# Patient Record
Sex: Male | Born: 1950
Health system: Southern US, Community
[De-identification: ages and names within clinical notes are randomized; demographics above are authoritative.]

## PROBLEM LIST (undated history)

## (undated) DIAGNOSIS — E785 Hyperlipidemia, unspecified: Secondary | ICD-10-CM

## (undated) DIAGNOSIS — G4730 Sleep apnea, unspecified: Secondary | ICD-10-CM

## (undated) DIAGNOSIS — I10 Essential (primary) hypertension: Secondary | ICD-10-CM

## (undated) DIAGNOSIS — K219 Gastro-esophageal reflux disease without esophagitis: Secondary | ICD-10-CM

## (undated) DIAGNOSIS — T7840XA Allergy, unspecified, initial encounter: Secondary | ICD-10-CM

## (undated) HISTORY — DX: Allergy, unspecified, initial encounter: T78.40XA

## (undated) HISTORY — DX: Hyperlipidemia, unspecified: E78.5

## (undated) HISTORY — DX: Gastro-esophageal reflux disease without esophagitis: K21.9

## (undated) HISTORY — DX: Sleep apnea, unspecified: G47.30

## (undated) HISTORY — DX: Essential (primary) hypertension: I10

---

## 1997-11-06 ENCOUNTER — Ambulatory Visit (HOSPITAL_COMMUNITY): Admission: RE | Admit: 1997-11-06 | Discharge: 1997-11-06 | Payer: Self-pay | Admitting: Ophthalmology

## 1998-01-21 ENCOUNTER — Ambulatory Visit: Admission: RE | Admit: 1998-01-21 | Discharge: 1998-01-21 | Payer: Self-pay | Admitting: Internal Medicine

## 1998-01-27 ENCOUNTER — Emergency Department (HOSPITAL_COMMUNITY): Admission: EM | Admit: 1998-01-27 | Discharge: 1998-01-27 | Payer: Self-pay | Admitting: Emergency Medicine

## 1998-01-27 ENCOUNTER — Encounter: Payer: Self-pay | Admitting: Emergency Medicine

## 1998-03-14 ENCOUNTER — Ambulatory Visit: Admission: RE | Admit: 1998-03-14 | Discharge: 1998-03-14 | Payer: Self-pay | Admitting: Internal Medicine

## 1998-07-29 ENCOUNTER — Encounter: Admission: RE | Admit: 1998-07-29 | Discharge: 1998-07-29 | Payer: Self-pay | Admitting: Internal Medicine

## 1998-12-08 ENCOUNTER — Emergency Department (HOSPITAL_COMMUNITY): Admission: EM | Admit: 1998-12-08 | Discharge: 1998-12-08 | Payer: Self-pay | Admitting: Emergency Medicine

## 1998-12-08 ENCOUNTER — Encounter: Payer: Self-pay | Admitting: Emergency Medicine

## 1998-12-29 ENCOUNTER — Encounter: Payer: Self-pay | Admitting: Family Medicine

## 1998-12-29 ENCOUNTER — Ambulatory Visit (HOSPITAL_COMMUNITY): Admission: RE | Admit: 1998-12-29 | Discharge: 1998-12-29 | Payer: Self-pay | Admitting: Family Medicine

## 1999-01-15 ENCOUNTER — Encounter: Payer: Self-pay | Admitting: Family Medicine

## 1999-01-15 ENCOUNTER — Ambulatory Visit (HOSPITAL_COMMUNITY): Admission: RE | Admit: 1999-01-15 | Discharge: 1999-01-15 | Payer: Self-pay | Admitting: Family Medicine

## 1999-02-24 ENCOUNTER — Observation Stay (HOSPITAL_COMMUNITY): Admission: RE | Admit: 1999-02-24 | Discharge: 1999-02-25 | Payer: Self-pay | Admitting: Otolaryngology

## 2000-02-14 ENCOUNTER — Ambulatory Visit (HOSPITAL_COMMUNITY): Admission: RE | Admit: 2000-02-14 | Discharge: 2000-02-14 | Payer: Self-pay | Admitting: Gastroenterology

## 2000-02-29 ENCOUNTER — Encounter: Admission: RE | Admit: 2000-02-29 | Discharge: 2000-02-29 | Payer: Self-pay | Admitting: Otolaryngology

## 2000-02-29 ENCOUNTER — Encounter: Payer: Self-pay | Admitting: Otolaryngology

## 2000-03-10 ENCOUNTER — Ambulatory Visit (HOSPITAL_COMMUNITY): Admission: RE | Admit: 2000-03-10 | Discharge: 2000-03-10 | Payer: Self-pay | Admitting: Otolaryngology

## 2000-03-10 ENCOUNTER — Encounter: Payer: Self-pay | Admitting: Otolaryngology

## 2000-06-16 ENCOUNTER — Encounter: Admission: RE | Admit: 2000-06-16 | Discharge: 2000-06-16 | Payer: Self-pay | Admitting: *Deleted

## 2000-07-26 ENCOUNTER — Encounter: Admission: RE | Admit: 2000-07-26 | Discharge: 2000-07-26 | Payer: Self-pay | Admitting: Otolaryngology

## 2000-07-26 ENCOUNTER — Encounter: Payer: Self-pay | Admitting: Otolaryngology

## 2000-07-28 ENCOUNTER — Ambulatory Visit (HOSPITAL_BASED_OUTPATIENT_CLINIC_OR_DEPARTMENT_OTHER): Admission: RE | Admit: 2000-07-28 | Discharge: 2000-07-28 | Payer: Self-pay | Admitting: Otolaryngology

## 2000-07-28 ENCOUNTER — Encounter (INDEPENDENT_AMBULATORY_CARE_PROVIDER_SITE_OTHER): Payer: Self-pay | Admitting: *Deleted

## 2002-01-08 ENCOUNTER — Ambulatory Visit (HOSPITAL_COMMUNITY): Admission: RE | Admit: 2002-01-08 | Discharge: 2002-01-08 | Payer: Self-pay | Admitting: *Deleted

## 2002-01-08 ENCOUNTER — Encounter: Payer: Self-pay | Admitting: *Deleted

## 2002-06-28 ENCOUNTER — Encounter (INDEPENDENT_AMBULATORY_CARE_PROVIDER_SITE_OTHER): Payer: Self-pay | Admitting: *Deleted

## 2002-06-28 ENCOUNTER — Ambulatory Visit (HOSPITAL_COMMUNITY): Admission: RE | Admit: 2002-06-28 | Discharge: 2002-06-28 | Payer: Self-pay | Admitting: Gastroenterology

## 2010-04-11 HISTORY — PX: NASAL SINUS SURGERY: SHX719

## 2010-04-26 ENCOUNTER — Observation Stay (HOSPITAL_COMMUNITY)
Admission: EM | Admit: 2010-04-26 | Discharge: 2010-04-27 | Payer: Self-pay | Source: Home / Self Care | Attending: Internal Medicine | Admitting: Internal Medicine

## 2010-04-27 ENCOUNTER — Encounter (INDEPENDENT_AMBULATORY_CARE_PROVIDER_SITE_OTHER): Payer: Self-pay | Admitting: Internal Medicine

## 2010-04-28 LAB — DIFFERENTIAL
Basophils Absolute: 0 10*3/uL (ref 0.0–0.1)
Basophils Relative: 1 % (ref 0–1)
Eosinophils Absolute: 0.2 10*3/uL (ref 0.0–0.7)
Eosinophils Relative: 4 % (ref 0–5)
Lymphocytes Relative: 29 % (ref 12–46)
Lymphs Abs: 1.3 10*3/uL (ref 0.7–4.0)
Monocytes Absolute: 0.5 10*3/uL (ref 0.1–1.0)
Monocytes Relative: 11 % (ref 3–12)
Neutro Abs: 2.4 10*3/uL (ref 1.7–7.7)
Neutrophils Relative %: 55 % (ref 43–77)

## 2010-04-28 LAB — HEMOGLOBIN A1C
Hgb A1c MFr Bld: 5.7 % — ABNORMAL HIGH (ref <5.7)
Mean Plasma Glucose: 117 mg/dL — ABNORMAL HIGH (ref <117)

## 2010-04-28 LAB — POCT CARDIAC MARKERS
CKMB, poc: 2 ng/mL (ref 1.0–8.0)
CKMB, poc: <1 ng/mL — ABNORMAL LOW (ref 1.0–8.0)
Myoglobin, poc: 323 ng/mL (ref 12–200)
Myoglobin, poc: 48.9 ng/mL (ref 12–200)
Troponin i, poc: 0.06 ng/mL (ref 0.00–0.09)
Troponin i, poc: <0.05 ng/mL (ref 0.00–0.09)

## 2010-04-28 LAB — BASIC METABOLIC PANEL
BUN: 15 mg/dL (ref 6–23)
CO2: 25 mEq/L (ref 19–32)
Calcium: 8.6 mg/dL (ref 8.4–10.5)
Chloride: 105 mEq/L (ref 96–112)
Creatinine, Ser: 1.05 mg/dL (ref 0.4–1.5)
GFR calc Af Amer: >60 mL/min (ref >60)
GFR calc non Af Amer: >60 mL/min (ref >60)
Glucose, Bld: 160 mg/dL — ABNORMAL HIGH (ref 70–99)
Potassium: 3.3 mEq/L — ABNORMAL LOW (ref 3.5–5.1)
Sodium: 141 mEq/L (ref 135–145)

## 2010-04-28 LAB — CK TOTAL AND CKMB (NOT AT ARMC)
CK, MB: 2.2 ng/mL (ref 0.3–4.0)
Relative Index: 1.7 (ref 0.0–2.5)
Total CK: 129 U/L (ref 7–232)

## 2010-04-28 LAB — BRAIN NATRIURETIC PEPTIDE
Pro B Natriuretic peptide (BNP): <30 pg/mL (ref 0.0–100.0)
Pro B Natriuretic peptide (BNP): <30 pg/mL (ref 0.0–100.0)

## 2010-04-28 LAB — CBC
HCT: 36.1 % — ABNORMAL LOW (ref 39.0–52.0)
HCT: 35 % — ABNORMAL LOW (ref 39.0–52.0)
Hemoglobin: 12.4 g/dL — ABNORMAL LOW (ref 13.0–17.0)
Hemoglobin: 11.9 g/dL — ABNORMAL LOW (ref 13.0–17.0)
MCH: 31.6 pg (ref 26.0–34.0)
MCH: 31.4 pg (ref 26.0–34.0)
MCHC: 34.3 g/dL (ref 30.0–36.0)
MCHC: 34 g/dL (ref 30.0–36.0)
MCV: 92.1 fL (ref 78.0–100.0)
MCV: 92.3 fL (ref 78.0–100.0)
Platelets: 202 10*3/uL (ref 150–400)
Platelets: 208 10*3/uL (ref 150–400)
RBC: 3.92 MIL/uL — ABNORMAL LOW (ref 4.22–5.81)
RBC: 3.79 MIL/uL — ABNORMAL LOW (ref 4.22–5.81)
RDW: 13.1 % (ref 11.5–15.5)
RDW: 13 % (ref 11.5–15.5)
WBC: 4.3 10*3/uL (ref 4.0–10.5)
WBC: 5.8 10*3/uL (ref 4.0–10.5)

## 2010-04-28 LAB — CARDIAC PANEL(CRET KIN+CKTOT+MB+TROPI)
CK, MB: 1.8 ng/mL (ref 0.3–4.0)
Relative Index: 1.7 (ref 0.0–2.5)
Total CK: 108 U/L (ref 7–232)
Troponin I: 0.01 ng/mL (ref 0.00–0.06)

## 2010-04-28 LAB — TROPONIN I: Troponin I: <0.01 ng/mL (ref 0.00–0.06)

## 2010-04-28 LAB — TSH: TSH: 3.139 u[IU]/mL (ref 0.350–4.500)

## 2010-05-02 ENCOUNTER — Encounter: Payer: Self-pay | Admitting: Orthopedic Surgery

## 2010-08-27 NOTE — Op Note (Signed)
   NAME:  Louis Barnes, VERGE                      ACCOUNT NO.:  [id number]   MEDICAL RECORD NO.:  [id number]                   PATIENT TYPE:  AMB   LOCATION:  ENDO                                 FACILITY:  [facility]   PHYSICIAN:  Danise Edge, M.D.                DATE OF BIRTH:  Dec 06, 1950   DATE OF PROCEDURE:  06/28/2002  DATE OF DISCHARGE:                                 OPERATIVE REPORT   REFERRED BY:  Thora Lance, M.D.   PROCEDURE INDICATIONS:  Mr. Edenilson Austad is a 60 year old male, born  03/25/1951.  Mr. Allende is scheduled to undergo his first screening  colonoscopy with polypectomy to prevent colon cancer.   ENDOSCOPIST:  Danise Edge, M.D.   PREMEDICATION:  Demerol 50 mg, Versed 10 mg.   ENDOSCOPE:  Olympus adult colonoscope.   PROCEDURE:  After obtaining informed consent, Mr. Gilberg was placed in the  left lateral decubitus position.  I administered intravenous Demerol and  intravenous Versed to achieve conscious sedation for the procedure.  The  patient's blood pressure, oxygen saturation, and cardiac rhythm were  monitored throughout the procedure and documented in the medical record.   Anal inspection was normal.  Digital rectal exam reveals a small, non-  nodular prostate.  The Olympus adult colonoscope was introduced into the  rectum and easily advanced to the cecum.  A normal-appearing ileocecal valve  was intubated and the distal ileum inspected.  Colonic preparation for the  exam today was excellent.   Rectum:  From the mid rectum, a 1-mm sessile polyp was removed with a cold  biopsy forceps.   Sigmoid colon and descending colon:  Left colonic diverticulosis.   Splenic flexure:  Normal.   Transverse colon:  Normal.   Hepatic flexure:  Normal.   Ascending colon:  Normal.   Cecum and ileocecal valve:  Normal.   Distal ileum:  Normal.    ASSESSMENT:  1. Left colonic diverticulosis.  2. A diminutive polyp was removed from the mid  rectum.                                               Danise Edge, M.D.    MJ/MEDQ  D:  06/28/2002  T:  06/29/2002  Job:  [id number]   cc:   Thora Lance, M.D.  [address]  [place]  [place] [address]  Fax: [phone]   Titus Dubin. Alwyn Ren, M.D. [facility]

## 2010-08-27 NOTE — Op Note (Signed)
[place]  Patient:    Louis Barnes, Louis Barnes                   MRN: [id number] Proc. Date: 07/28/00 Adm. Date:  [id number] Attending:  Carlean Purl CC:         Titus Dubin. Alwyn Ren, M.D. [facility]   Operative Report  PREOPERATIVE DIAGNOSIS:  Chronic left submandibular sialadenitis with chronic left submandibular pain.  POSTOPERATIVE DIAGNOSIS:  Chronic left submandibular sialadenitis with chronic left submandibular pain.  OPERATION:  Excision of left submandibular gland.  SURGEON:  Kristine Garbe. Ezzard Standing, M.D.  ANESTHESIA:  General endotracheal anesthesia.  COMPLICATIONS:  None.  BRIEF CLINICAL NOTE:  Louis Barnes is a 60 year old gentleman who has had chronic left submandibular pain for several years.  He apparently has had previous episodes of swelling of the submandibular gland.  More recently the gland has really not been swollen, but he has had more just chronic pain.  On examination in the office, he has fairly benign exam of the submandibular gland, although it is painful to palpation.  Because of the chronic discomfort, he is taken to the operating room at this time for excision of left submandibular gland.  DESCRIPTION OF PROCEDURE:  After adequate endotracheal anesthesia, the patient received 1 g Ancef IV preoperatively.  The left neck was prepped with Betadine solution and draped out with sterile towels.  The proposed incision site was marked out and injected with Xylocaine with epinephrine for hemostasis.  A horizontal incision was made just on the inferior edge of the submandibular gland.  The platysma muscle was then transected, and the inferior edge of the gland was identified at the upper portion of our incision.  The capsule of the gland was identified, and dissection was carried out on the gland, retracting the capsule of the gland superiorly to avoid damage to the marginal nerve. The facial vein and artery were  identified in the posterior of the gland, and the small branches to the gland were ligated with 3-0 silk ligatures and divided.  The gland was then freed up and dissected out of the submandibular fossa.  The submandibular duct was clamped, ligated with 2-0 silk ligature, and the branch of the lingual nerve was also clamped, divided, and ligated with 3-0 silk ligature.  Hemostasis was obtained with bipolar cautery.  The wound was irrigated with saline and closed with 3-0 chromic sutures subcutaneously and to reapproximate the platysma muscle and 5-0 nylon on the skin, charged with local anesthesia and transferred to the recovery room postoperatively doing well.  DISPOSITION:  Louis Barnes is discharged home later this morning on Keflex 500 b.i.d. for 5 days and Tylenol No. 3 p.r.n. pain.  We will have him follow up in my office in six to seven days for recheck and to have sutures removed. DD:  07/28/00 TD:  07/29/00 Job: [id number]

## 2011-09-19 ENCOUNTER — Other Ambulatory Visit: Payer: Self-pay | Admitting: Dermatology

## 2011-11-21 ENCOUNTER — Encounter: Payer: Self-pay | Admitting: Internal Medicine

## 2011-12-22 ENCOUNTER — Ambulatory Visit (AMBULATORY_SURGERY_CENTER): Payer: BC Managed Care – PPO | Admitting: *Deleted

## 2011-12-22 VITALS — Ht 70.0 in | Wt 213.6 lb

## 2011-12-22 DIAGNOSIS — Z1211 Encounter for screening for malignant neoplasm of colon: Secondary | ICD-10-CM

## 2011-12-22 MED ORDER — MOVIPREP 100 G PO SOLR: ORAL | Status: DC

## 2011-12-23 ENCOUNTER — Encounter: Payer: Self-pay | Admitting: Internal Medicine

## 2011-12-26 ENCOUNTER — Telehealth: Payer: Self-pay | Admitting: Internal Medicine

## 2011-12-26 NOTE — Telephone Encounter (Signed)
No charge,

## 2011-12-28 ENCOUNTER — Encounter: Payer: BC Managed Care – PPO | Admitting: Internal Medicine

## 2012-09-13 ENCOUNTER — Other Ambulatory Visit: Payer: Self-pay | Admitting: Gastroenterology

## 2013-06-11 ENCOUNTER — Encounter: Payer: Self-pay | Admitting: *Deleted

## 2013-06-11 DIAGNOSIS — I10 Essential (primary) hypertension: Secondary | ICD-10-CM | POA: Insufficient documentation

## 2013-06-11 DIAGNOSIS — L509 Urticaria, unspecified: Secondary | ICD-10-CM | POA: Insufficient documentation

## 2013-06-11 DIAGNOSIS — N529 Male erectile dysfunction, unspecified: Secondary | ICD-10-CM | POA: Insufficient documentation

## 2013-06-11 DIAGNOSIS — N400 Enlarged prostate without lower urinary tract symptoms: Secondary | ICD-10-CM | POA: Insufficient documentation

## 2013-06-11 DIAGNOSIS — G4733 Obstructive sleep apnea (adult) (pediatric): Secondary | ICD-10-CM | POA: Insufficient documentation

## 2015-07-16 DIAGNOSIS — J019 Acute sinusitis, unspecified: Secondary | ICD-10-CM | POA: Diagnosis not present

## 2015-08-19 DIAGNOSIS — I10 Essential (primary) hypertension: Secondary | ICD-10-CM | POA: Diagnosis not present

## 2015-08-19 DIAGNOSIS — G4733 Obstructive sleep apnea (adult) (pediatric): Secondary | ICD-10-CM | POA: Diagnosis not present

## 2015-08-19 DIAGNOSIS — K219 Gastro-esophageal reflux disease without esophagitis: Secondary | ICD-10-CM | POA: Diagnosis not present

## 2015-08-19 DIAGNOSIS — N400 Enlarged prostate without lower urinary tract symptoms: Secondary | ICD-10-CM | POA: Diagnosis not present

## 2015-09-15 DIAGNOSIS — J343 Hypertrophy of nasal turbinates: Secondary | ICD-10-CM | POA: Diagnosis not present

## 2015-09-15 DIAGNOSIS — J310 Chronic rhinitis: Secondary | ICD-10-CM | POA: Diagnosis not present

## 2015-09-15 DIAGNOSIS — J0141 Acute recurrent pansinusitis: Secondary | ICD-10-CM | POA: Diagnosis not present

## 2015-10-07 DIAGNOSIS — J310 Chronic rhinitis: Secondary | ICD-10-CM | POA: Diagnosis not present

## 2015-10-07 DIAGNOSIS — J0141 Acute recurrent pansinusitis: Secondary | ICD-10-CM | POA: Diagnosis not present

## 2015-10-07 DIAGNOSIS — G4733 Obstructive sleep apnea (adult) (pediatric): Secondary | ICD-10-CM | POA: Diagnosis not present

## 2015-11-03 DIAGNOSIS — N410 Acute prostatitis: Secondary | ICD-10-CM | POA: Diagnosis not present

## 2015-11-24 DIAGNOSIS — J209 Acute bronchitis, unspecified: Secondary | ICD-10-CM | POA: Diagnosis not present

## 2015-11-24 DIAGNOSIS — J0140 Acute pansinusitis, unspecified: Secondary | ICD-10-CM | POA: Diagnosis not present

## 2015-11-29 DIAGNOSIS — J209 Acute bronchitis, unspecified: Secondary | ICD-10-CM | POA: Diagnosis not present

## 2015-12-08 DIAGNOSIS — J45901 Unspecified asthma with (acute) exacerbation: Secondary | ICD-10-CM | POA: Diagnosis not present

## 2016-01-12 DIAGNOSIS — J069 Acute upper respiratory infection, unspecified: Secondary | ICD-10-CM | POA: Diagnosis not present

## 2016-02-22 DIAGNOSIS — Z0000 Encounter for general adult medical examination without abnormal findings: Secondary | ICD-10-CM | POA: Diagnosis not present

## 2016-02-22 DIAGNOSIS — I10 Essential (primary) hypertension: Secondary | ICD-10-CM | POA: Diagnosis not present

## 2016-02-22 DIAGNOSIS — E7800 Pure hypercholesterolemia, unspecified: Secondary | ICD-10-CM | POA: Diagnosis not present

## 2016-02-22 DIAGNOSIS — Z23 Encounter for immunization: Secondary | ICD-10-CM | POA: Diagnosis not present

## 2016-02-22 DIAGNOSIS — G4733 Obstructive sleep apnea (adult) (pediatric): Secondary | ICD-10-CM | POA: Diagnosis not present

## 2016-02-22 DIAGNOSIS — K219 Gastro-esophageal reflux disease without esophagitis: Secondary | ICD-10-CM | POA: Diagnosis not present

## 2016-04-11 DIAGNOSIS — J019 Acute sinusitis, unspecified: Secondary | ICD-10-CM | POA: Diagnosis not present

## 2016-04-11 DIAGNOSIS — J209 Acute bronchitis, unspecified: Secondary | ICD-10-CM | POA: Diagnosis not present

## 2016-04-20 DIAGNOSIS — M722 Plantar fascial fibromatosis: Secondary | ICD-10-CM | POA: Diagnosis not present

## 2016-04-22 DIAGNOSIS — J069 Acute upper respiratory infection, unspecified: Secondary | ICD-10-CM | POA: Diagnosis not present

## 2016-05-07 DIAGNOSIS — H6500 Acute serous otitis media, unspecified ear: Secondary | ICD-10-CM | POA: Diagnosis not present

## 2016-05-07 DIAGNOSIS — J309 Allergic rhinitis, unspecified: Secondary | ICD-10-CM | POA: Diagnosis not present

## 2016-05-07 DIAGNOSIS — J209 Acute bronchitis, unspecified: Secondary | ICD-10-CM | POA: Diagnosis not present

## 2016-05-27 DIAGNOSIS — M25562 Pain in left knee: Secondary | ICD-10-CM | POA: Diagnosis not present

## 2016-06-10 DIAGNOSIS — G4733 Obstructive sleep apnea (adult) (pediatric): Secondary | ICD-10-CM | POA: Diagnosis not present

## 2016-08-08 DIAGNOSIS — D1801 Hemangioma of skin and subcutaneous tissue: Secondary | ICD-10-CM | POA: Diagnosis not present

## 2016-08-08 DIAGNOSIS — L821 Other seborrheic keratosis: Secondary | ICD-10-CM | POA: Diagnosis not present

## 2016-08-08 DIAGNOSIS — D225 Melanocytic nevi of trunk: Secondary | ICD-10-CM | POA: Diagnosis not present

## 2016-08-08 DIAGNOSIS — L570 Actinic keratosis: Secondary | ICD-10-CM | POA: Diagnosis not present

## 2016-08-08 DIAGNOSIS — L820 Inflamed seborrheic keratosis: Secondary | ICD-10-CM | POA: Diagnosis not present

## 2016-08-08 DIAGNOSIS — D224 Melanocytic nevi of scalp and neck: Secondary | ICD-10-CM | POA: Diagnosis not present

## 2016-08-22 DIAGNOSIS — M5432 Sciatica, left side: Secondary | ICD-10-CM | POA: Diagnosis not present

## 2016-08-22 DIAGNOSIS — K219 Gastro-esophageal reflux disease without esophagitis: Secondary | ICD-10-CM | POA: Diagnosis not present

## 2016-08-22 DIAGNOSIS — N400 Enlarged prostate without lower urinary tract symptoms: Secondary | ICD-10-CM | POA: Diagnosis not present

## 2016-08-22 DIAGNOSIS — I10 Essential (primary) hypertension: Secondary | ICD-10-CM | POA: Diagnosis not present

## 2016-09-12 DIAGNOSIS — J019 Acute sinusitis, unspecified: Secondary | ICD-10-CM | POA: Diagnosis not present

## 2016-10-04 DIAGNOSIS — M25552 Pain in left hip: Secondary | ICD-10-CM | POA: Diagnosis not present

## 2016-10-04 DIAGNOSIS — S83282A Other tear of lateral meniscus, current injury, left knee, initial encounter: Secondary | ICD-10-CM | POA: Diagnosis not present

## 2016-10-08 DIAGNOSIS — M25562 Pain in left knee: Secondary | ICD-10-CM | POA: Diagnosis not present

## 2016-12-23 DIAGNOSIS — M6208 Separation of muscle (nontraumatic), other site: Secondary | ICD-10-CM | POA: Diagnosis not present

## 2017-01-26 DIAGNOSIS — M6208 Separation of muscle (nontraumatic), other site: Secondary | ICD-10-CM | POA: Diagnosis not present

## 2017-02-05 DIAGNOSIS — J0110 Acute frontal sinusitis, unspecified: Secondary | ICD-10-CM | POA: Diagnosis not present

## 2017-02-05 DIAGNOSIS — J0100 Acute maxillary sinusitis, unspecified: Secondary | ICD-10-CM | POA: Diagnosis not present

## 2017-02-07 DIAGNOSIS — H5203 Hypermetropia, bilateral: Secondary | ICD-10-CM | POA: Diagnosis not present

## 2017-02-07 DIAGNOSIS — H524 Presbyopia: Secondary | ICD-10-CM | POA: Diagnosis not present

## 2017-02-23 ENCOUNTER — Other Ambulatory Visit: Payer: Self-pay | Admitting: Internal Medicine

## 2017-02-23 DIAGNOSIS — Z23 Encounter for immunization: Secondary | ICD-10-CM | POA: Diagnosis not present

## 2017-02-23 DIAGNOSIS — Z0000 Encounter for general adult medical examination without abnormal findings: Secondary | ICD-10-CM | POA: Diagnosis not present

## 2017-02-23 DIAGNOSIS — G4733 Obstructive sleep apnea (adult) (pediatric): Secondary | ICD-10-CM | POA: Diagnosis not present

## 2017-02-23 DIAGNOSIS — G4700 Insomnia, unspecified: Secondary | ICD-10-CM | POA: Diagnosis not present

## 2017-02-23 DIAGNOSIS — M6208 Separation of muscle (nontraumatic), other site: Secondary | ICD-10-CM

## 2017-02-23 DIAGNOSIS — E7800 Pure hypercholesterolemia, unspecified: Secondary | ICD-10-CM | POA: Diagnosis not present

## 2017-02-23 DIAGNOSIS — I10 Essential (primary) hypertension: Secondary | ICD-10-CM | POA: Diagnosis not present

## 2017-02-23 DIAGNOSIS — K219 Gastro-esophageal reflux disease without esophagitis: Secondary | ICD-10-CM | POA: Diagnosis not present

## 2017-02-28 ENCOUNTER — Other Ambulatory Visit: Payer: Self-pay | Admitting: Internal Medicine

## 2017-02-28 DIAGNOSIS — M6208 Separation of muscle (nontraumatic), other site: Secondary | ICD-10-CM

## 2017-03-01 ENCOUNTER — Other Ambulatory Visit: Payer: Self-pay

## 2017-03-06 ENCOUNTER — Ambulatory Visit
Admission: RE | Admit: 2017-03-06 | Discharge: 2017-03-06 | Disposition: A | Discharge status: Home / Self Care | Payer: BLUE CROSS/BLUE SHIELD | Source: Ambulatory Visit | Attending: Internal Medicine | Admitting: Internal Medicine

## 2017-03-06 DIAGNOSIS — M6208 Separation of muscle (nontraumatic), other site: Secondary | ICD-10-CM

## 2017-03-06 DIAGNOSIS — K7689 Other specified diseases of liver: Secondary | ICD-10-CM | POA: Diagnosis not present

## 2017-03-14 ENCOUNTER — Telehealth: Payer: Self-pay | Admitting: Physical Therapy

## 2017-03-14 NOTE — Telephone Encounter (Signed)
Left message on 02/27/17, spoke with pt on 11/26 and he wants to wait for US results on 11/28. Left message on 03/14/17 to call to schedule PT eval

## 2017-04-09 DIAGNOSIS — J209 Acute bronchitis, unspecified: Secondary | ICD-10-CM | POA: Diagnosis not present

## 2017-04-21 ENCOUNTER — Other Ambulatory Visit: Payer: Self-pay

## 2017-04-21 ENCOUNTER — Encounter: Payer: Self-pay | Admitting: Physical Therapy

## 2017-04-21 ENCOUNTER — Ambulatory Visit: Payer: BLUE CROSS/BLUE SHIELD | Attending: Internal Medicine | Admitting: Physical Therapy

## 2017-04-21 DIAGNOSIS — R1010 Upper abdominal pain, unspecified: Secondary | ICD-10-CM | POA: Diagnosis not present

## 2017-04-21 DIAGNOSIS — M6281 Muscle weakness (generalized): Secondary | ICD-10-CM | POA: Diagnosis not present

## 2017-04-21 NOTE — Therapy (Signed)
[facility] [address] [place], [place], [address] Phone: [phone]   Fax:  [phone]  Physical Therapy Evaluation  Patient Details  Name: Louis Barnes MRN: [id number] Date of Birth: 12-19-1950 Referring Provider: Kirby Funk   Encounter Date: 04/21/2017  PT End of Session - 04/21/17 1048    Visit Number  1    Date for PT Re-Evaluation  06/19/17    PT Start Time  1003    PT Stop Time  1052    PT Time Calculation (min)  49 min    Activity Tolerance  Patient tolerated treatment well    Behavior During Therapy  [facility] for tasks assessed/performed       Past Medical History:  Diagnosis Date  . Allergy   . GERD (gastroesophageal reflux disease)   . Hyperlipidemia   . Hypertension   . Sleep apnea    cpap    Past Surgical History:  Procedure Laterality Date  . NASAL SINUS SURGERY  2012    There were no vitals filed for this visit.   Subjective Assessment - 04/21/17 1006    Subjective  Patient reports that he started noticing abdominal discomfort about 2-3 years.  He reports that recently he has had some increased abdominal pain, he has been diagnosed with a diastisis recti.      Limitations  Lifting    Patient Stated Goals  have less pain    Currently in Pain?  Yes    Pain Score  1     Pain Location  Abdomen    Pain Orientation  Upper    Pain Descriptors / Indicators  Discomfort    Pain Type  Chronic pain    Pain Onset  More than a month ago    Pain Frequency  Intermittent    Aggravating Factors   ab roller, twisting, planks, pushups pain can be up 6/10 with him being careful, reports prior to really watching what he does pain was a 9-10/10    Pain Relieving Factors  lie down pain can be gone, reports being very careful has helped a lot    Effect of Pain on Daily Activities  I really just have to be careful         [facility] PT Assessment - 04/21/17 0001      Assessment   Medical Diagnosis   diastisis recti    Referring Provider  Kirby Funk    Onset Date/Surgical Date  03/21/17    Prior Therapy  no      Precautions   Precaution Comments  none      Balance Screen   Has the patient fallen in the past 6 months  No    Has the patient had a decrease in activity level because of a fear of falling?   No    Is the patient reluctant to leave their home because of a fear of falling?   No      Home Environment   Additional Comments  does yardwork, housework      Prior Function   Level of Independence  Independent    Vocation  Full time employment    Vocation Requirements  Mostly sitting    Leisure  has had to stop playing racquest ball, does a lot of exercise, was doing planks, ab roller and pushups      ROM / Strength   AROM / PROM / Strength  AROM;PROM;Strength  AROM   Overall AROM Comments  WFL's but pain inthe abdomen with lumbar extension      Strength   Overall Strength Comments  4+/5 with pain inthe abdomen for hip flexion      Flexibility   Soft Tissue Assessment /Muscle Length  yes    Hamstrings  very tight    Quadriceps  tight    Piriformis  tight      Palpation   Palpation comment  very tender to palpation over the diastisis recti, has good abdominal contraction with 2 finger width seperation             Objective measurements completed on examination: See above findings.              PT Education - 04/21/17 1047    Education provided  Yes    Education Details  HEP for self bracing with sheet, starting with pelvic tilts and then tilts with single leg lifts, TA activation, went over body mechanics to decrease stress onthe spine and abdomen    Person(s) Educated  Patient    Methods  Explanation;Demonstration;Verbal cues;Tactile cues;Handout    Comprehension  Verbalized understanding       PT Short Term Goals - 04/21/17 1056      PT SHORT TERM GOAL #1   Title  independent with initial HEP    Time  2    Period  Weeks    Status   New        PT Long Term Goals - 04/21/17 1056      PT LONG TERM GOAL #1   Title  understand proper posture and body mechanics to limit pain in the abdomen    Time  6    Period  Weeks    Status  New      PT LONG TERM GOAL #2   Title  decrease pain with ADL's 50%    Time  8    Period  Weeks    Status  New      PT LONG TERM GOAL #4   Title  return to gym without issues safely    Time  8    Period  Weeks    Status  New             Plan - 04/21/17 1049    Clinical Impression Statement  Patient with abdominal pain for about 3 years, he has been diagnosed with a diastisis recti, I measured it today it is 6" long from the sternum to the navel and is 2 finger widths.  Pressure over this is "uncomfortable".  Has some limitaiton in LE strength due to pain in the abdomen, rotation causes increased pain.  He uses poor body mechanics that incresases pressure on the abdomen and I went over this with him.  He is tight in the HS which could increase abdominal pressure with activity.  He is very active and is working out, I questioned him and it seems that he is doing many things that may not be good for his diagnosis, like double leg lifts, an ab roller etc..    Clinical Presentation  Stable    Clinical Decision Making  Low    Rehab Potential  Good    PT Frequency  1x / week    PT Duration  6 weeks    PT Treatment/Interventions  ADLs/Self Care Home Management;Functional mobility training;Neuromuscular re-education;Patient/family education    PT Next Visit Plan  may need self bracing, will  need to work on TA activation, anti rotation exercises but limit abdominal pressure that causes bulging and seperation    Consulted and Agree with Plan of Care  Patient       Patient will benefit from skilled therapeutic intervention in order to improve the following deficits and impairments:  Decreased range of motion, Pain, Impaired flexibility, Improper body mechanics, Decreased strength  Visit  Diagnosis: Muscle weakness (generalized) - Plan: PT plan of care cert/re-cert  Pain of upper abdomen - Plan: PT plan of care cert/re-cert     Problem List Patient Active Problem List   Diagnosis Date Noted  . BPH (benign prostatic hypertrophy) 06/11/2013  . Hypertension 06/11/2013  . Obstructive sleep apnea 06/11/2013  . Erectile dysfunction 06/11/2013  . Urticaria 06/11/2013    Jearld Lesch., PT 04/21/2017, 10:59 AM  [facility] [address] [place], [place], [address] Phone: [phone]   Fax:  [phone]  Name: Louis Barnes MRN: [id number] Date of Birth: 12/03/1950

## 2017-04-23 DIAGNOSIS — J019 Acute sinusitis, unspecified: Secondary | ICD-10-CM | POA: Diagnosis not present

## 2017-04-23 DIAGNOSIS — J069 Acute upper respiratory infection, unspecified: Secondary | ICD-10-CM | POA: Diagnosis not present

## 2017-05-04 ENCOUNTER — Ambulatory Visit: Payer: BLUE CROSS/BLUE SHIELD | Admitting: Physical Therapy

## 2017-05-04 ENCOUNTER — Encounter: Payer: Self-pay | Admitting: Physical Therapy

## 2017-05-04 DIAGNOSIS — M6281 Muscle weakness (generalized): Secondary | ICD-10-CM

## 2017-05-04 DIAGNOSIS — R1010 Upper abdominal pain, unspecified: Secondary | ICD-10-CM | POA: Diagnosis not present

## 2017-05-04 NOTE — Therapy (Signed)
[facility] [address] [place], [place], [address] Phone: [phone]   Fax:  [phone]  Physical Therapy Treatment  Patient Details  Name: Louis Barnes MRN: [id number] Date of Birth: October 03, 1950 Referring Provider: Kirby Funk   Encounter Date: 05/04/2017  PT End of Session - 05/04/17 1717    Visit Number  2    Date for PT Re-Evaluation  06/19/17    PT Start Time  1615    PT Stop Time  1648    PT Time Calculation (min)  33 min    Activity Tolerance  Patient tolerated treatment well    Behavior During Therapy  [facility] for tasks assessed/performed       Past Medical History:  Diagnosis Date  . Allergy   . GERD (gastroesophageal reflux disease)   . Hyperlipidemia   . Hypertension   . Sleep apnea    cpap    Past Surgical History:  Procedure Laterality Date  . NASAL SINUS SURGERY  2012    There were no vitals filed for this visit.  Subjective Assessment - 05/04/17 1710    Subjective  Patient reports no issues with the exercises.                      [facility] Adult PT Treatment/Exercise - 05/04/17 0001      Self-Care   Self-Care  Other Self-Care Comments;ADL's    ADL's  Went over correct transitions from supine to sit to decrease abdominal pressure, proper lifting techniques    Other Self-Care Comments   went over abdominal binder/bracing for advanced exercises as he wants to do heavier weight and to return to racquestball, went over the outcome of not making the diastisis recti worse.  went over gym activitiies TA activation, we talked about the outcomes, talked about surgery etc..               PT Short Term Goals - 04/21/17 1056      PT SHORT TERM GOAL #1   Title  independent with initial HEP    Time  2    Period  Weeks    Status  New        PT Long Term Goals - 05/04/17 1722      PT LONG TERM GOAL #1   Title  understand proper posture and body mechanics to limit  pain in the abdomen    Status  Achieved      PT LONG TERM GOAL #2   Title  decrease pain with ADL's 50%    Status  Achieved            Plan - 05/04/17 1717    Clinical Impression Statement  Patient really is doing a lot on his own, I tried to educate as good as I could about the possible risk with some of his activities that he could cause further seperation, I talked about TA and core activation prior any heavy activity, we talked about abdominal bracing for prevention if he returns to racquetball.  I really respect the fact that he is so active but told him I worry about further seperation, went over body mechanics for just ADL's to limit stress on the abdomen.      PT Next Visit Plan  Will D/C, patient has a great understanding of his diagnosis and how to prevent further seperation    Consulted and Agree with Plan of Care  Patient  Patient will benefit from skilled therapeutic intervention in order to improve the following deficits and impairments:  Decreased range of motion, Pain, Impaired flexibility, Improper body mechanics, Decreased strength  Visit Diagnosis: Muscle weakness (generalized)  Pain of upper abdomen     Problem List Patient Active Problem List   Diagnosis Date Noted  . BPH (benign prostatic hypertrophy) 06/11/2013  . Hypertension 06/11/2013  . Obstructive sleep apnea 06/11/2013  . Erectile dysfunction 06/11/2013  . Urticaria 06/11/2013    Louis Barnes,Louis Lesh W., PT 05/04/2017, 5:23 PM  [facility] [address] [place], [place], [address] Phone: [phone]   Fax:  [phone]  Name: Louis Barnes MRN: [id number] Date of Birth: Sep 08, 1950

## 2017-08-10 DIAGNOSIS — D216 Benign neoplasm of connective and other soft tissue of trunk, unspecified: Secondary | ICD-10-CM | POA: Diagnosis not present

## 2017-08-10 DIAGNOSIS — R52 Pain, unspecified: Secondary | ICD-10-CM | POA: Diagnosis not present

## 2017-08-10 DIAGNOSIS — L570 Actinic keratosis: Secondary | ICD-10-CM | POA: Diagnosis not present

## 2017-08-10 DIAGNOSIS — L821 Other seborrheic keratosis: Secondary | ICD-10-CM | POA: Diagnosis not present

## 2017-08-10 DIAGNOSIS — L820 Inflamed seborrheic keratosis: Secondary | ICD-10-CM | POA: Diagnosis not present

## 2017-08-10 DIAGNOSIS — D2261 Melanocytic nevi of right upper limb, including shoulder: Secondary | ICD-10-CM | POA: Diagnosis not present

## 2017-08-10 DIAGNOSIS — D3614 Benign neoplasm of peripheral nerves and autonomic nervous system of thorax: Secondary | ICD-10-CM | POA: Diagnosis not present

## 2017-08-10 DIAGNOSIS — D1801 Hemangioma of skin and subcutaneous tissue: Secondary | ICD-10-CM | POA: Diagnosis not present

## 2017-08-10 DIAGNOSIS — L814 Other melanin hyperpigmentation: Secondary | ICD-10-CM | POA: Diagnosis not present

## 2017-09-05 DIAGNOSIS — F320 Major depressive disorder, single episode, mild: Secondary | ICD-10-CM | POA: Diagnosis not present

## 2017-09-05 DIAGNOSIS — I10 Essential (primary) hypertension: Secondary | ICD-10-CM | POA: Diagnosis not present

## 2017-09-05 DIAGNOSIS — K219 Gastro-esophageal reflux disease without esophagitis: Secondary | ICD-10-CM | POA: Diagnosis not present

## 2017-09-05 DIAGNOSIS — G4733 Obstructive sleep apnea (adult) (pediatric): Secondary | ICD-10-CM | POA: Diagnosis not present

## 2017-09-28 DIAGNOSIS — G4733 Obstructive sleep apnea (adult) (pediatric): Secondary | ICD-10-CM | POA: Diagnosis not present

## 2018-02-26 DIAGNOSIS — M7582 Other shoulder lesions, left shoulder: Secondary | ICD-10-CM | POA: Diagnosis not present

## 2018-02-26 DIAGNOSIS — M542 Cervicalgia: Secondary | ICD-10-CM | POA: Diagnosis not present

## 2018-02-28 DIAGNOSIS — Z125 Encounter for screening for malignant neoplasm of prostate: Secondary | ICD-10-CM | POA: Diagnosis not present

## 2018-02-28 DIAGNOSIS — I10 Essential (primary) hypertension: Secondary | ICD-10-CM | POA: Diagnosis not present

## 2018-02-28 DIAGNOSIS — G4733 Obstructive sleep apnea (adult) (pediatric): Secondary | ICD-10-CM | POA: Diagnosis not present

## 2018-02-28 DIAGNOSIS — K219 Gastro-esophageal reflux disease without esophagitis: Secondary | ICD-10-CM | POA: Diagnosis not present

## 2018-02-28 DIAGNOSIS — Z0000 Encounter for general adult medical examination without abnormal findings: Secondary | ICD-10-CM | POA: Diagnosis not present

## 2018-02-28 DIAGNOSIS — E7800 Pure hypercholesterolemia, unspecified: Secondary | ICD-10-CM | POA: Diagnosis not present

## 2018-02-28 DIAGNOSIS — Z23 Encounter for immunization: Secondary | ICD-10-CM | POA: Diagnosis not present

## 2018-02-28 DIAGNOSIS — G4700 Insomnia, unspecified: Secondary | ICD-10-CM | POA: Diagnosis not present

## 2018-04-29 DIAGNOSIS — J189 Pneumonia, unspecified organism: Secondary | ICD-10-CM | POA: Diagnosis not present

## 2018-04-29 DIAGNOSIS — R05 Cough: Secondary | ICD-10-CM | POA: Diagnosis not present

## 2018-05-19 DIAGNOSIS — J019 Acute sinusitis, unspecified: Secondary | ICD-10-CM | POA: Diagnosis not present

## 2018-08-13 DIAGNOSIS — H1820 Unspecified corneal edema: Secondary | ICD-10-CM | POA: Diagnosis not present

## 2018-08-13 DIAGNOSIS — H16201 Unspecified keratoconjunctivitis, right eye: Secondary | ICD-10-CM | POA: Diagnosis not present

## 2018-08-20 DIAGNOSIS — H16201 Unspecified keratoconjunctivitis, right eye: Secondary | ICD-10-CM | POA: Diagnosis not present

## 2018-08-30 DIAGNOSIS — M5432 Sciatica, left side: Secondary | ICD-10-CM | POA: Diagnosis not present

## 2018-08-30 DIAGNOSIS — I10 Essential (primary) hypertension: Secondary | ICD-10-CM | POA: Diagnosis not present

## 2018-08-30 DIAGNOSIS — G4733 Obstructive sleep apnea (adult) (pediatric): Secondary | ICD-10-CM | POA: Diagnosis not present

## 2018-08-30 DIAGNOSIS — K219 Gastro-esophageal reflux disease without esophagitis: Secondary | ICD-10-CM | POA: Diagnosis not present

## 2018-09-13 DIAGNOSIS — L988 Other specified disorders of the skin and subcutaneous tissue: Secondary | ICD-10-CM | POA: Diagnosis not present

## 2018-09-13 DIAGNOSIS — L821 Other seborrheic keratosis: Secondary | ICD-10-CM | POA: Diagnosis not present

## 2018-09-13 DIAGNOSIS — D485 Neoplasm of uncertain behavior of skin: Secondary | ICD-10-CM | POA: Diagnosis not present

## 2018-09-13 DIAGNOSIS — L570 Actinic keratosis: Secondary | ICD-10-CM | POA: Diagnosis not present

## 2018-09-13 DIAGNOSIS — D225 Melanocytic nevi of trunk: Secondary | ICD-10-CM | POA: Diagnosis not present

## 2019-01-09 DIAGNOSIS — Z03818 Encounter for observation for suspected exposure to other biological agents ruled out: Secondary | ICD-10-CM | POA: Diagnosis not present

## 2019-01-09 DIAGNOSIS — Z20828 Contact with and (suspected) exposure to other viral communicable diseases: Secondary | ICD-10-CM | POA: Diagnosis not present

## 2019-01-14 DIAGNOSIS — Z20828 Contact with and (suspected) exposure to other viral communicable diseases: Secondary | ICD-10-CM | POA: Diagnosis not present

## 2019-03-27 DIAGNOSIS — H40052 Ocular hypertension, left eye: Secondary | ICD-10-CM | POA: Diagnosis not present

## 2019-03-27 DIAGNOSIS — H52203 Unspecified astigmatism, bilateral: Secondary | ICD-10-CM | POA: Diagnosis not present

## 2019-03-27 DIAGNOSIS — H5203 Hypermetropia, bilateral: Secondary | ICD-10-CM | POA: Diagnosis not present

## 2019-04-01 DIAGNOSIS — Z0000 Encounter for general adult medical examination without abnormal findings: Secondary | ICD-10-CM | POA: Diagnosis not present

## 2019-04-01 DIAGNOSIS — G4700 Insomnia, unspecified: Secondary | ICD-10-CM | POA: Diagnosis not present

## 2019-04-01 DIAGNOSIS — I10 Essential (primary) hypertension: Secondary | ICD-10-CM | POA: Diagnosis not present

## 2019-04-01 DIAGNOSIS — Z1159 Encounter for screening for other viral diseases: Secondary | ICD-10-CM | POA: Diagnosis not present

## 2019-04-01 DIAGNOSIS — E7800 Pure hypercholesterolemia, unspecified: Secondary | ICD-10-CM | POA: Diagnosis not present

## 2019-04-01 DIAGNOSIS — K219 Gastro-esophageal reflux disease without esophagitis: Secondary | ICD-10-CM | POA: Diagnosis not present

## 2019-04-01 DIAGNOSIS — N400 Enlarged prostate without lower urinary tract symptoms: Secondary | ICD-10-CM | POA: Diagnosis not present

## 2019-04-18 DIAGNOSIS — Z1159 Encounter for screening for other viral diseases: Secondary | ICD-10-CM | POA: Diagnosis not present

## 2019-04-23 DIAGNOSIS — Z86010 Personal history of colonic polyps: Secondary | ICD-10-CM | POA: Diagnosis not present

## 2019-04-23 DIAGNOSIS — D123 Benign neoplasm of transverse colon: Secondary | ICD-10-CM | POA: Diagnosis not present

## 2019-04-23 DIAGNOSIS — K621 Rectal polyp: Secondary | ICD-10-CM | POA: Diagnosis not present

## 2019-04-23 DIAGNOSIS — D125 Benign neoplasm of sigmoid colon: Secondary | ICD-10-CM | POA: Diagnosis not present

## 2019-04-30 ENCOUNTER — Ambulatory Visit: Payer: BC Managed Care – PPO | Attending: Internal Medicine

## 2019-04-30 DIAGNOSIS — Z23 Encounter for immunization: Secondary | ICD-10-CM | POA: Diagnosis not present

## 2019-04-30 NOTE — Progress Notes (Signed)
   Covid-19 Vaccination Clinic  Name:  MADDOCK FINIGAN    MRN: [id number] DOB: 1950-07-04  04/30/2019  Mr. Mans was observed post Covid-19 immunization for 30 minutes based on pre-vaccination screening without incidence. He was provided with Vaccine Information Sheet and instruction to access the V-Safe system.   Mr. Sermon was instructed to call 911 with any severe reactions post vaccine: Marland Kitchen Difficulty breathing  . Swelling of your face and throat  . A fast heartbeat  . A bad rash all over your body  . Dizziness and weakness    Immunizations Administered    Name Date Dose VIS Date Route   Pfizer COVID-19 Vaccine 04/30/2019 12:16 AM 0.3 mL 03/22/2019 Intramuscular   Manufacturer: [employer], [employer]   Lot: [id number]   NDC: [address]-2

## 2019-05-21 ENCOUNTER — Ambulatory Visit: Payer: BC Managed Care – PPO | Attending: Internal Medicine

## 2019-05-21 DIAGNOSIS — Z23 Encounter for immunization: Secondary | ICD-10-CM | POA: Insufficient documentation

## 2019-05-21 NOTE — Progress Notes (Signed)
   Covid-19 Vaccination Clinic  Name:  Louis Barnes    MRN: [id number] DOB: 19-Jan-1951  05/21/2019  [name] was observed post Covid-19 immunization for 15 minutes without incidence. He was provided with Vaccine Information Sheet and instruction to access the V-Safe system.   [name] was instructed to call 911 with any severe reactions post vaccine: Marland Kitchen Difficulty breathing  . Swelling of your face and throat  . A fast heartbeat  . A bad rash all over your body  . Dizziness and weakness    Immunizations Administered    Name Date Dose VIS Date Route   Pfizer COVID-19 Vaccine 05/21/2019 12:47 PM 0.3 mL 03/22/2019 Intramuscular   Manufacturer: [employer], [employer]   Lot: [id number]   NDC: [address]-3

## 2019-05-29 ENCOUNTER — Ambulatory Visit: Payer: BLUE CROSS/BLUE SHIELD

## 2019-06-07 DIAGNOSIS — K649 Unspecified hemorrhoids: Secondary | ICD-10-CM | POA: Diagnosis not present

## 2019-09-19 DIAGNOSIS — L814 Other melanin hyperpigmentation: Secondary | ICD-10-CM | POA: Diagnosis not present

## 2019-09-19 DIAGNOSIS — L821 Other seborrheic keratosis: Secondary | ICD-10-CM | POA: Diagnosis not present

## 2019-09-19 DIAGNOSIS — L570 Actinic keratosis: Secondary | ICD-10-CM | POA: Diagnosis not present

## 2019-09-19 DIAGNOSIS — D1801 Hemangioma of skin and subcutaneous tissue: Secondary | ICD-10-CM | POA: Diagnosis not present

## 2019-09-25 DIAGNOSIS — H40052 Ocular hypertension, left eye: Secondary | ICD-10-CM | POA: Diagnosis not present

## 2019-10-01 DIAGNOSIS — N400 Enlarged prostate without lower urinary tract symptoms: Secondary | ICD-10-CM | POA: Diagnosis not present

## 2019-10-01 DIAGNOSIS — K219 Gastro-esophageal reflux disease without esophagitis: Secondary | ICD-10-CM | POA: Diagnosis not present

## 2019-10-01 DIAGNOSIS — G4700 Insomnia, unspecified: Secondary | ICD-10-CM | POA: Diagnosis not present

## 2019-10-01 DIAGNOSIS — I10 Essential (primary) hypertension: Secondary | ICD-10-CM | POA: Diagnosis not present

## 2019-10-14 DIAGNOSIS — J0100 Acute maxillary sinusitis, unspecified: Secondary | ICD-10-CM | POA: Diagnosis not present

## 2019-11-11 DIAGNOSIS — J069 Acute upper respiratory infection, unspecified: Secondary | ICD-10-CM | POA: Diagnosis not present

## 2019-11-15 DIAGNOSIS — Z20822 Contact with and (suspected) exposure to covid-19: Secondary | ICD-10-CM | POA: Diagnosis not present

## 2019-11-15 DIAGNOSIS — J209 Acute bronchitis, unspecified: Secondary | ICD-10-CM | POA: Diagnosis not present

## 2019-11-17 ENCOUNTER — Other Ambulatory Visit: Payer: Self-pay | Admitting: Physician Assistant

## 2019-11-17 DIAGNOSIS — I10 Essential (primary) hypertension: Secondary | ICD-10-CM

## 2019-11-17 DIAGNOSIS — U071 COVID-19: Secondary | ICD-10-CM

## 2019-11-17 MED ORDER — SODIUM CHLORIDE 0.9 % IV SOLN
1200.0000 mg | Freq: Once | INTRAVENOUS | Status: AC
  Administered 2019-11-18: 1200 mg via INTRAVENOUS
  Filled 2019-11-17: qty 1200

## 2019-11-17 NOTE — Progress Notes (Addendum)
I connected by phone with Louis Barnes on 11/17/2019 at 8:02 AM to discuss the potential use of a new treatment for mild to moderate COVID-19 viral infection in non-hospitalized patients.  This patient is a 69 y.o. male that meets the FDA criteria for Emergency Use Authorization of COVID monoclonal antibody casirivimab/imdevimab.  Has a (+) direct SARS-CoV-2 viral test result  Has mild or moderate COVID-19   Is NOT hospitalized due to COVID-19  Is within 10 days of symptom onset  Has at least one of the high risk factor(s) for progression to severe COVID-19 and/or hospitalization as defined in EUA.  Specific high risk criteria : Older age (>/= 71 yo), HTN   I have spoken and communicated the following to the patient or parent/caregiver regarding COVID monoclonal antibody treatment:  1. FDA has authorized the emergency use for the treatment of mild to moderate COVID-19 in adults and pediatric patients with positive results of direct SARS-CoV-2 viral testing who are 71 years of age and older weighing at least 40 kg, and who are at high risk for progressing to severe COVID-19 and/or hospitalization.  2. The significant known and potential risks and benefits of COVID monoclonal antibody, and the extent to which such potential risks and benefits are unknown.  3. Information on available alternative treatments and the risks and benefits of those alternatives, including clinical trials.  4. Patients treated with COVID monoclonal antibody should continue to self-isolate and use infection control measures (e.g., wear mask, isolate, social distance, avoid sharing personal items, clean and disinfect "high touch" surfaces, and frequent handwashing) according to CDC guidelines.   5. The patient or parent/caregiver has the option to accept or refuse COVID monoclonal antibody treatment.  After reviewing this information with the patient, The patient agreed to proceed with receiving  casirivimab\imdevimab infusion and will be provided a copy of the Fact sheet prior to receiving the infusion.  Sx onset 8/1. Set up for infusion on 8/9 @ 2:30pm. Directions given to [facility]. Pt is aware that insurance will be charged an infusion fee. Of note, pt is fully vaccinated with Pfizer and added to our breakthrough case list.   Cline Crock 11/17/2019 8:02 AM

## 2019-11-18 ENCOUNTER — Ambulatory Visit (HOSPITAL_COMMUNITY)
Admission: RE | Admit: 2019-11-18 | Discharge: 2019-11-18 | Disposition: A | Discharge status: Home / Self Care | Payer: BC Managed Care – PPO | Source: Ambulatory Visit | Attending: Pulmonary Disease | Admitting: Pulmonary Disease

## 2019-11-18 DIAGNOSIS — I10 Essential (primary) hypertension: Secondary | ICD-10-CM | POA: Diagnosis not present

## 2019-11-18 DIAGNOSIS — U071 COVID-19: Secondary | ICD-10-CM | POA: Diagnosis not present

## 2019-11-18 MED ORDER — DIPHENHYDRAMINE HCL 50 MG/ML IJ SOLN: 50.0000 mg | Freq: Once | INTRAMUSCULAR | Status: DC | PRN

## 2019-11-18 MED ORDER — SODIUM CHLORIDE 0.9 % IV SOLN: INTRAVENOUS | Status: DC | PRN

## 2019-11-18 MED ORDER — EPINEPHRINE 0.3 MG/0.3ML IJ SOAJ: 0.3000 mg | Freq: Once | INTRAMUSCULAR | Status: DC | PRN

## 2019-11-18 MED ORDER — ALBUTEROL SULFATE HFA 108 (90 BASE) MCG/ACT IN AERS: 2.0000 | INHALATION_SPRAY | Freq: Once | RESPIRATORY_TRACT | Status: DC | PRN

## 2019-11-18 MED ORDER — METHYLPREDNISOLONE SODIUM SUCC 125 MG IJ SOLR: 125.0000 mg | Freq: Once | INTRAMUSCULAR | Status: DC | PRN

## 2019-11-18 MED ORDER — FAMOTIDINE IN NACL 20-0.9 MG/50ML-% IV SOLN: 20.0000 mg | Freq: Once | INTRAVENOUS | Status: DC | PRN

## 2019-11-18 NOTE — Discharge Instructions (Signed)

## 2019-11-18 NOTE — Progress Notes (Signed)
  Diagnosis: COVID-19  Physician: Patrick wright   Procedure: Covid Infusion Clinic Med: casirivimab\imdevimab infusion - Provided patient with casirivimab\imdevimab fact sheet for patients, parents and caregivers prior to infusion.  Complications: No immediate complications noted.  Discharge: Discharged home   Giovany Cosby R 11/18/2019   

## 2019-12-06 ENCOUNTER — Ambulatory Visit
Admission: RE | Admit: 2019-12-06 | Discharge: 2019-12-06 | Disposition: A | Discharge status: Home / Self Care | Payer: BC Managed Care – PPO | Source: Ambulatory Visit | Attending: Internal Medicine | Admitting: Internal Medicine

## 2019-12-06 ENCOUNTER — Other Ambulatory Visit: Payer: Self-pay | Admitting: Internal Medicine

## 2019-12-06 DIAGNOSIS — R0609 Other forms of dyspnea: Secondary | ICD-10-CM | POA: Diagnosis not present

## 2019-12-06 DIAGNOSIS — R0600 Dyspnea, unspecified: Secondary | ICD-10-CM

## 2019-12-06 DIAGNOSIS — U071 COVID-19: Secondary | ICD-10-CM | POA: Diagnosis not present

## 2020-03-27 DIAGNOSIS — M5432 Sciatica, left side: Secondary | ICD-10-CM | POA: Diagnosis not present

## 2020-04-15 DIAGNOSIS — G4733 Obstructive sleep apnea (adult) (pediatric): Secondary | ICD-10-CM | POA: Diagnosis not present

## 2020-04-15 DIAGNOSIS — E7800 Pure hypercholesterolemia, unspecified: Secondary | ICD-10-CM | POA: Diagnosis not present

## 2020-04-15 DIAGNOSIS — Z79899 Other long term (current) drug therapy: Secondary | ICD-10-CM | POA: Diagnosis not present

## 2020-04-15 DIAGNOSIS — Z0000 Encounter for general adult medical examination without abnormal findings: Secondary | ICD-10-CM | POA: Diagnosis not present

## 2020-04-15 DIAGNOSIS — K219 Gastro-esophageal reflux disease without esophagitis: Secondary | ICD-10-CM | POA: Diagnosis not present

## 2020-04-15 DIAGNOSIS — N400 Enlarged prostate without lower urinary tract symptoms: Secondary | ICD-10-CM | POA: Diagnosis not present

## 2020-04-15 DIAGNOSIS — I10 Essential (primary) hypertension: Secondary | ICD-10-CM | POA: Diagnosis not present

## 2020-04-16 DIAGNOSIS — M5416 Radiculopathy, lumbar region: Secondary | ICD-10-CM | POA: Diagnosis not present

## 2020-04-20 DIAGNOSIS — N179 Acute kidney failure, unspecified: Secondary | ICD-10-CM | POA: Diagnosis not present

## 2020-04-21 DIAGNOSIS — H52203 Unspecified astigmatism, bilateral: Secondary | ICD-10-CM | POA: Diagnosis not present

## 2020-04-21 DIAGNOSIS — H5203 Hypermetropia, bilateral: Secondary | ICD-10-CM | POA: Diagnosis not present

## 2020-04-21 DIAGNOSIS — H40053 Ocular hypertension, bilateral: Secondary | ICD-10-CM | POA: Diagnosis not present

## 2020-05-06 DIAGNOSIS — M545 Low back pain, unspecified: Secondary | ICD-10-CM | POA: Diagnosis not present

## 2020-05-06 DIAGNOSIS — M5416 Radiculopathy, lumbar region: Secondary | ICD-10-CM | POA: Diagnosis not present

## 2020-05-14 DIAGNOSIS — I10 Essential (primary) hypertension: Secondary | ICD-10-CM | POA: Diagnosis not present

## 2020-05-14 DIAGNOSIS — M5416 Radiculopathy, lumbar region: Secondary | ICD-10-CM | POA: Diagnosis not present

## 2020-05-14 DIAGNOSIS — Z6830 Body mass index (BMI) 30.0-30.9, adult: Secondary | ICD-10-CM | POA: Diagnosis not present

## 2020-05-28 DIAGNOSIS — M5416 Radiculopathy, lumbar region: Secondary | ICD-10-CM | POA: Diagnosis not present

## 2020-08-12 DIAGNOSIS — L814 Other melanin hyperpigmentation: Secondary | ICD-10-CM | POA: Diagnosis not present

## 2020-08-12 DIAGNOSIS — D225 Melanocytic nevi of trunk: Secondary | ICD-10-CM | POA: Diagnosis not present

## 2020-08-12 DIAGNOSIS — L570 Actinic keratosis: Secondary | ICD-10-CM | POA: Diagnosis not present

## 2020-08-12 DIAGNOSIS — L738 Other specified follicular disorders: Secondary | ICD-10-CM | POA: Diagnosis not present

## 2020-08-12 DIAGNOSIS — L821 Other seborrheic keratosis: Secondary | ICD-10-CM | POA: Diagnosis not present

## 2020-09-06 DIAGNOSIS — R051 Acute cough: Secondary | ICD-10-CM | POA: Diagnosis not present

## 2020-09-06 DIAGNOSIS — J029 Acute pharyngitis, unspecified: Secondary | ICD-10-CM | POA: Diagnosis not present

## 2020-09-06 DIAGNOSIS — Z20822 Contact with and (suspected) exposure to covid-19: Secondary | ICD-10-CM | POA: Diagnosis not present

## 2020-09-08 DIAGNOSIS — J019 Acute sinusitis, unspecified: Secondary | ICD-10-CM | POA: Diagnosis not present

## 2020-10-19 DIAGNOSIS — I10 Essential (primary) hypertension: Secondary | ICD-10-CM | POA: Diagnosis not present

## 2020-10-19 DIAGNOSIS — N400 Enlarged prostate without lower urinary tract symptoms: Secondary | ICD-10-CM | POA: Diagnosis not present

## 2020-10-19 DIAGNOSIS — K219 Gastro-esophageal reflux disease without esophagitis: Secondary | ICD-10-CM | POA: Diagnosis not present

## 2020-10-19 DIAGNOSIS — G4733 Obstructive sleep apnea (adult) (pediatric): Secondary | ICD-10-CM | POA: Diagnosis not present

## 2020-11-28 DIAGNOSIS — J0140 Acute pansinusitis, unspecified: Secondary | ICD-10-CM | POA: Diagnosis not present

## 2020-11-28 DIAGNOSIS — J069 Acute upper respiratory infection, unspecified: Secondary | ICD-10-CM | POA: Diagnosis not present

## 2020-11-28 DIAGNOSIS — R519 Headache, unspecified: Secondary | ICD-10-CM | POA: Diagnosis not present

## 2020-11-28 DIAGNOSIS — R0981 Nasal congestion: Secondary | ICD-10-CM | POA: Diagnosis not present

## 2020-11-28 DIAGNOSIS — Z03818 Encounter for observation for suspected exposure to other biological agents ruled out: Secondary | ICD-10-CM | POA: Diagnosis not present

## 2021-01-06 DIAGNOSIS — I499 Cardiac arrhythmia, unspecified: Secondary | ICD-10-CM | POA: Diagnosis not present

## 2021-01-11 DIAGNOSIS — J019 Acute sinusitis, unspecified: Secondary | ICD-10-CM | POA: Diagnosis not present

## 2021-01-27 DIAGNOSIS — G4733 Obstructive sleep apnea (adult) (pediatric): Secondary | ICD-10-CM | POA: Diagnosis not present

## 2021-02-01 DIAGNOSIS — J019 Acute sinusitis, unspecified: Secondary | ICD-10-CM | POA: Diagnosis not present

## 2021-04-22 DIAGNOSIS — G4733 Obstructive sleep apnea (adult) (pediatric): Secondary | ICD-10-CM | POA: Diagnosis not present

## 2021-04-22 DIAGNOSIS — E7800 Pure hypercholesterolemia, unspecified: Secondary | ICD-10-CM | POA: Diagnosis not present

## 2021-04-22 DIAGNOSIS — M5432 Sciatica, left side: Secondary | ICD-10-CM | POA: Diagnosis not present

## 2021-04-22 DIAGNOSIS — I10 Essential (primary) hypertension: Secondary | ICD-10-CM | POA: Diagnosis not present

## 2021-04-22 DIAGNOSIS — Z0000 Encounter for general adult medical examination without abnormal findings: Secondary | ICD-10-CM | POA: Diagnosis not present

## 2021-04-23 DIAGNOSIS — H2513 Age-related nuclear cataract, bilateral: Secondary | ICD-10-CM | POA: Diagnosis not present

## 2021-04-23 DIAGNOSIS — H52203 Unspecified astigmatism, bilateral: Secondary | ICD-10-CM | POA: Diagnosis not present

## 2021-05-12 DIAGNOSIS — N289 Disorder of kidney and ureter, unspecified: Secondary | ICD-10-CM | POA: Diagnosis not present

## 2021-05-29 DIAGNOSIS — J209 Acute bronchitis, unspecified: Secondary | ICD-10-CM | POA: Diagnosis not present

## 2021-05-29 DIAGNOSIS — R059 Cough, unspecified: Secondary | ICD-10-CM | POA: Diagnosis not present

## 2021-05-29 DIAGNOSIS — Z20822 Contact with and (suspected) exposure to covid-19: Secondary | ICD-10-CM | POA: Diagnosis not present

## 2021-05-29 DIAGNOSIS — H6503 Acute serous otitis media, bilateral: Secondary | ICD-10-CM | POA: Diagnosis not present

## 2021-06-17 DIAGNOSIS — Z6829 Body mass index (BMI) 29.0-29.9, adult: Secondary | ICD-10-CM | POA: Diagnosis not present

## 2021-06-17 DIAGNOSIS — I10 Essential (primary) hypertension: Secondary | ICD-10-CM | POA: Diagnosis not present

## 2021-06-17 DIAGNOSIS — M5416 Radiculopathy, lumbar region: Secondary | ICD-10-CM | POA: Diagnosis not present

## 2021-06-23 DIAGNOSIS — M2578 Osteophyte, vertebrae: Secondary | ICD-10-CM | POA: Diagnosis not present

## 2021-06-23 DIAGNOSIS — M545 Low back pain, unspecified: Secondary | ICD-10-CM | POA: Diagnosis not present

## 2021-06-23 DIAGNOSIS — M5416 Radiculopathy, lumbar region: Secondary | ICD-10-CM | POA: Diagnosis not present

## 2021-07-01 ENCOUNTER — Ambulatory Visit: Payer: BC Managed Care – PPO | Attending: Neurological Surgery | Admitting: Physical Therapy

## 2021-07-09 DIAGNOSIS — Z20822 Contact with and (suspected) exposure to covid-19: Secondary | ICD-10-CM | POA: Diagnosis not present

## 2021-07-09 DIAGNOSIS — U071 COVID-19: Secondary | ICD-10-CM | POA: Diagnosis not present

## 2021-07-09 DIAGNOSIS — R051 Acute cough: Secondary | ICD-10-CM | POA: Diagnosis not present

## 2021-07-09 DIAGNOSIS — J019 Acute sinusitis, unspecified: Secondary | ICD-10-CM | POA: Diagnosis not present

## 2021-07-12 DIAGNOSIS — M5416 Radiculopathy, lumbar region: Secondary | ICD-10-CM | POA: Diagnosis not present

## 2021-07-20 DIAGNOSIS — Z6829 Body mass index (BMI) 29.0-29.9, adult: Secondary | ICD-10-CM | POA: Diagnosis not present

## 2021-07-20 DIAGNOSIS — M5416 Radiculopathy, lumbar region: Secondary | ICD-10-CM | POA: Diagnosis not present

## 2021-07-22 ENCOUNTER — Ambulatory Visit: Payer: BC Managed Care – PPO | Attending: Neurological Surgery | Admitting: Physical Therapy

## 2021-07-22 ENCOUNTER — Encounter: Payer: Self-pay | Admitting: Physical Therapy

## 2021-07-22 DIAGNOSIS — M5459 Other low back pain: Secondary | ICD-10-CM | POA: Insufficient documentation

## 2021-07-22 DIAGNOSIS — R29898 Other symptoms and signs involving the musculoskeletal system: Secondary | ICD-10-CM | POA: Insufficient documentation

## 2021-07-22 DIAGNOSIS — R293 Abnormal posture: Secondary | ICD-10-CM | POA: Insufficient documentation

## 2021-07-22 NOTE — Therapy (Signed)
[place] [facility] ?[address] ?[place], [place], [address] ?Phone: [phone]   Fax:  [phone] ? ?Physical Therapy Evaluation ? ?Patient Details  ?Name: Louis Barnes ?MRN: [id number] ?Date of Birth: 05-01-1950 ?Referring Provider (PT): Marikay Alar ? ? ?Encounter Date: 07/22/2021 ? ? PT End of Session - 07/22/21 0929   ? ? Visit Number 1   ? Number of Visits 9   ? Date for PT Re-Evaluation 09/02/21   ? Authorization Type BCBS   ? Authorization Time Period 07/22/21 to 09/02/21 (for schedule flexibility)   ? Authorization - Number of Visits 60   ? PT Start Time [phone]   ? PT Stop Time 0926   ? PT Time Calculation (min) 40 min   ? Activity Tolerance Patient tolerated treatment well   ? Behavior During Therapy [facility] for tasks assessed/performed   ? ?  ?  ? ?  ? ? ?Past Medical History:  ?Diagnosis Date  ? Allergy   ? GERD (gastroesophageal reflux disease)   ? Hyperlipidemia   ? Hypertension   ? Sleep apnea   ? cpap  ? ? ?Past Surgical History:  ?Procedure Laterality Date  ? NASAL SINUS SURGERY  2012  ? ? ?There were no vitals filed for this visit. ? ? ? Subjective Assessment - 07/22/21 0847   ? ? Subjective I have a herniated disc at L4-L5. I have pain. I have pain going down into my left buttock near my spine. It only goes into my buttock. It hurts all the time. I don't have high or low expectations I don't know that PT will work necessarily because herniated is herniated. I have a very high pain threshold.   ? Patient Stated Goals good question I don't know   ? Currently in Pain? Yes   ? Pain Score 5    at worst 9/10  ? Pain Location Back   ? Pain Orientation Left   ? Pain Descriptors / Indicators Other (Comment)   pinch  ? Pain Type Chronic pain   ? Pain Radiating Towards into left buttock   ? Pain Onset More than a month ago   ? Pain Frequency Constant   ? Aggravating Factors  any kind of exercise like biking, pickle ball, anything that requires leg movement   ?  Pain Relieving Factors laying down and anti inflammatories   ? Effect of Pain on Daily Activities moderate   ? ?  ?  ? ?  ? ? ? ? ? [facility] PT Assessment - 07/22/21 0001   ? ?  ? Assessment  ? Medical Diagnosis back pain   ? Referring Provider (PT) Marikay Alar   ? Onset Date/Surgical Date --   2008  ? Next MD Visit Dr. Yetta Barre in June   ? Prior Therapy no PT in the past   ?  ? Precautions  ? Precautions None   ?  ? Restrictions  ? Weight Bearing Restrictions No   ?  ? Balance Screen  ? Has the patient fallen in the past 6 months No   ? Has the patient had a decrease in activity level because of a fear of falling?  No   ? Is the patient reluctant to leave their home because of a fear of falling?  No   ?  ? Prior Function  ? Level of Independence Independent;Independent with basic ADLs;Independent with gait;Independent with transfers   ? Vocation Full time employment   ?  Vocation Requirements financial advisor   ? Leisure bicycle riding, pickle ball   ?  ? Observation/Other Assessments  ? Focus on Therapeutic Outcomes (FOTO)  52   ?  ? ROM / Strength  ? AROM / PROM / Strength AROM;Strength   ?  ? AROM  ? AROM Assessment Site Hip;Knee;Ankle;Lumbar   ? Right/Left Hip Right;Left   ? Right/Left Knee Right;Left   ? Right/Left Ankle Left;Right   ? Lumbar Flexion moderate limitation   ? Lumbar Extension severe limitaiton, pianful   ? Lumbar - Right Side Bend severe limitation   ? Lumbar - Left Side Bend severe limitation   ?  ? Strength  ? Strength Assessment Site Hip;Knee;Ankle   ? Right/Left Hip Right;Left   ? Right Hip Flexion 4+/5   ? Right Hip Extension 3/5   ? Right Hip ABduction 4/5   ? Left Hip Flexion 4+/5   ? Left Hip Extension 3/5   ? Left Hip ABduction 4/5   ? Right/Left Knee Right;Left   ? Right Knee Flexion 4+/5   ? Right Knee Extension 5/5   ? Left Knee Flexion 4+/5   ? Left Knee Extension 5/5   ? Right/Left Ankle Right;Left   ? Right Ankle Dorsiflexion 5/5   ? Left Ankle Dorsiflexion 5/5   ?  ? Flexibility  ? Soft  Tissue Assessment /Muscle Length yes   ? Hamstrings severe tightness   ? Quadriceps severe tightness, hip flexors also extremely tight   ? Piriformis L moderately tight, R severely tight   ?  ? Palpation  ? Palpation comment very tight and sore in L glutes/piriformis area   ? ?  ?  ? ?  ? ? ? ? ? ? ? ? ? ? ? ? ? ?Objective measurements completed on examination: See above findings.  ? ? ? ? ? [facility] Adult PT Treatment/Exercise - 07/22/21 0001   ? ?  ? Exercises  ? Exercises Lumbar   ?  ? Lumbar Exercises: Stretches  ? Double Knee to Chest Stretch 5 reps   ? Double Knee to Chest Stretch Limitations 5 second holds   ? Lower Trunk Rotation 5 reps   ? Lower Trunk Rotation Limitations 5 second holds   ? Piriformis Stretch Right;Left;2 reps;30 seconds   ? Piriformis Stretch Limitations figure 4   ? Other Lumbar Stretch Exercise attemptd hip flexor stretch, increased pain   ?  ? Lumbar Exercises: Supine  ? Bridge 10 reps;2 seconds   ? ?  ?  ? ?  ? ? ? ? ? ? ? ? ? ? PT Education - 07/22/21 0929   ? ? Education Details exam findings, POC, HEP   ? Person(s) Educated Patient   ? Methods Explanation;Handout   ? Comprehension Verbalized understanding;Returned demonstration   ? ?  ?  ? ?  ? ? ? PT Short Term Goals - 07/22/21 0933   ? ?  ? PT SHORT TERM GOAL #1  ? Title Will be independent with appropriate progressive HEP   ? Time 2   ? Period Weeks   ? Status New   ? Target Date 08/05/21   ?  ? PT SHORT TERM GOAL #2  ? Title Will have improved postural awareness during functional task performance   ? Time 2   ? Period Weeks   ? Status New   ?  ? PT SHORT TERM GOAL #3  ? Title Mm spasms and cramp in L buttock  region to have improved by at least 50% to help improve pain   ? Time 2   ? Period Weeks   ? Status New   ?  ? PT SHORT TERM GOAL #4  ? Title Lumbar and hip ROM to have improved by at least 50% to help improve pain   ? Time 2   ? Period Weeks   ? Status New   ? ?  ?  ? ?  ? ? ? ? PT Long Term Goals - 07/22/21 1007   ? ?  ? PT  LONG TERM GOAL #1  ? Title MMT to improve to 5/5 in all weak groups   ? Time 4   ? Period Weeks   ? Status New   ? Target Date 08/19/21   ?  ? PT LONG TERM GOAL #2  ? Title Pain to be no more than 5/10 at worst   ? Time 4   ? Period Weeks   ? Status New   ?  ? PT LONG TERM GOAL #3  ? Title Will be able to perform functional sports and gym based activities without increase in pain   ? Time 4   ? Period Weeks   ? Status New   ?  ? PT LONG TERM GOAL #4  ? Title FOTO score to improve by 10 points to show improvement in condition   ? Time 4   ? Period Weeks   ? Status New   ? ?  ?  ? ?  ? ? ? ? ? ? ? ? ? Plan - 07/22/21 0930   ? ? Clinical Impression Statement Louis Barnes arrives today doing OK. It was very difficult to get much detail from him regarding his history of this pain and this did limit evaluation somewhat. Very vague in describing pain and pain patterns which also made evaluation difficult. Does have a hx of a laminectomy in the past to address the herniated disc in 2015 but pain has increased to prior levels. Exam reveals quite a bit of overall stiffness and impaired mobility, mild functional muscle weakness, and postural impairment. Might benefit from a trial of PT to see if we can help address impairments and improve pain.   ? Personal Factors and Comorbidities Fitness;Behavior Pattern;Past/Current Experience;Social Background;Time since onset of injury/illness/exacerbation   ? Examination-Activity Limitations Locomotion Level;Transfers;Bed Mobility;Reach Overhead;Bend;Sit;Sleep;Carry;Squat;Stairs;Stand;Lift   ? Examination-Participation Restrictions Occupation;Cleaning;Community Activity;Driving;Pincus [employer]ard Work   ? Stability/Clinical Decision Making Stable/Uncomplicated   ? Clinical Decision Making Low   ? Rehab Potential Good   ? PT Frequency 2x / week   ? PT Duration 4 weeks   ? PT Treatment/Interventions ADLs/Self Care Home Management;Cryotherapy;Electrical Stimulation;Iontophoresis 4mg /ml  Dexamethasone;Moist Heat;Traction;Ultrasound;Gait training;Stair training;Functional mobility training;Therapeutic activities;Therapeutic exercise;Balance training;Neuromuscular re-education;Patient/family education;[employer]

## 2021-07-25 DIAGNOSIS — J019 Acute sinusitis, unspecified: Secondary | ICD-10-CM | POA: Diagnosis not present

## 2021-07-29 ENCOUNTER — Ambulatory Visit: Payer: BC Managed Care – PPO | Admitting: Physical Therapy

## 2021-08-06 ENCOUNTER — Ambulatory Visit: Payer: BC Managed Care – PPO | Admitting: Physical Therapy

## 2021-08-12 ENCOUNTER — Ambulatory Visit: Payer: BC Managed Care – PPO | Attending: Neurological Surgery | Admitting: Physical Therapy

## 2021-08-12 ENCOUNTER — Encounter: Payer: Self-pay | Admitting: Physical Therapy

## 2021-08-12 DIAGNOSIS — M5459 Other low back pain: Secondary | ICD-10-CM | POA: Diagnosis not present

## 2021-08-12 DIAGNOSIS — R293 Abnormal posture: Secondary | ICD-10-CM | POA: Insufficient documentation

## 2021-08-12 DIAGNOSIS — R29898 Other symptoms and signs involving the musculoskeletal system: Secondary | ICD-10-CM | POA: Diagnosis not present

## 2021-08-12 DIAGNOSIS — L821 Other seborrheic keratosis: Secondary | ICD-10-CM | POA: Diagnosis not present

## 2021-08-12 DIAGNOSIS — D1801 Hemangioma of skin and subcutaneous tissue: Secondary | ICD-10-CM | POA: Diagnosis not present

## 2021-08-12 DIAGNOSIS — D225 Melanocytic nevi of trunk: Secondary | ICD-10-CM | POA: Diagnosis not present

## 2021-08-12 DIAGNOSIS — L814 Other melanin hyperpigmentation: Secondary | ICD-10-CM | POA: Diagnosis not present

## 2021-08-12 NOTE — Therapy (Signed)
[place] [facility] ?[address] ?[place], [place], [address] ?Phone: [phone]   Fax:  [phone] ? ?Physical Therapy Treatment ? ?Patient Details  ?Name: Louis Barnes ?MRN: [id number] ?Date of Birth: 1950/06/20 ?Referring Provider (PT): Marikay Alar ? ? ?Encounter Date: 08/12/2021 ? ? PT End of Session - 08/12/21 0835   ? ? Visit Number 2   ? Date for PT Re-Evaluation 09/02/21   ? Authorization Time Period 07/22/21 to 09/02/21 (for schedule flexibility)   ? PT Start Time 0800   ? PT Stop Time 0835   ? PT Time Calculation (min) 35 min   ? Activity Tolerance Patient tolerated treatment well   ? Behavior During Therapy [facility] for tasks assessed/performed   ? ?  ?  ? ?  ? ? ?Past Medical History:  ?Diagnosis Date  ? Allergy   ? GERD (gastroesophageal reflux disease)   ? Hyperlipidemia   ? Hypertension   ? Sleep apnea   ? cpap  ? ? ?Past Surgical History:  ?Procedure Laterality Date  ? NASAL SINUS SURGERY  2012  ? ? ?There were no vitals filed for this visit. ? ? Subjective Assessment - 08/12/21 0801   ? ? Subjective Always have pain, did the rowing machine last night.   ? Currently in Pain? Yes   ? Pain Score 5    ? Pain Location Back   ? ?  ?  ? ?  ? ? ? ? ? ? ? ? ? ? ? ? ? ? ? ? ? ? ? ? [facility] Adult PT Treatment/Exercise - 08/12/21 0001   ? ?  ? Lumbar Exercises: Machines for Strengthening  ? Other Lumbar Machine Exercise Rows & Lats 45lb 2x10   ?  ? Modalities  ? Modalities Traction   ?  ? Traction  ? Type of Traction Lumbar   ? Max (lbs) 75   ? Hold Time 10   ? Time 12   ? ?  ?  ? ?  ? ? ? ? ? ? ? ? ? ? ? ? PT Short Term Goals - 07/22/21 0933   ? ?  ? PT SHORT TERM GOAL #1  ? Title Will be independent with appropriate progressive HEP   ? Time 2   ? Period Weeks   ? Status New   ? Target Date 08/05/21   ?  ? PT SHORT TERM GOAL #2  ? Title Will have improved postural awareness during functional task performance   ? Time 2   ? Period Weeks   ? Status New   ?  ? PT SHORT  TERM GOAL #3  ? Title Mm spasms and cramp in L buttock region to have improved by at least 50% to help improve pain   ? Time 2   ? Period Weeks   ? Status New   ?  ? PT SHORT TERM GOAL #4  ? Title Lumbar and hip ROM to have improved by at least 50% to help improve pain   ? Time 2   ? Period Weeks   ? Status New   ? ?  ?  ? ?  ? ? ? ? PT Long Term Goals - 07/22/21 1007   ? ?  ? PT LONG TERM GOAL #1  ? Title MMT to improve to 5/5 in all weak groups   ? Time 4   ? Period Weeks   ? Status New   ?  Target Date 08/19/21   ?  ? PT LONG TERM GOAL #2  ? Title Pain to be no more than 5/10 at worst   ? Time 4   ? Period Weeks   ? Status New   ?  ? PT LONG TERM GOAL #3  ? Title Will be able to perform functional sports and gym based activities without increase in pain   ? Time 4   ? Period Weeks   ? Status New   ?  ? PT LONG TERM GOAL #4  ? Title FOTO score to improve by 10 points to show improvement in condition   ? Time 4   ? Period Weeks   ? Status New   ? ?  ?  ? ?  ? ? ? ? ? ? ? ? Plan - 08/12/21 0836   ? ? Clinical Impression Statement Pt enters clinic reporting that she always has pain. He stated that's he is very active working out regularly. No issue with an initial warm up and with seated rows and lats. Did a trial of lumbar traction over exercises because pt exercises on his own. After traction pt stated "This is a good thing."   ? Personal Factors and Comorbidities Fitness;Behavior Pattern;Past/Current Experience;Social Background;Time since onset of injury/illness/exacerbation   ? Examination-Activity Limitations Locomotion Level;Transfers;Bed Mobility;Reach Overhead;Bend;Sit;Sleep;Carry;Squat;Stairs;Stand;Lift   ? Examination-Participation Restrictions Occupation;Cleaning;Community Activity;Driving;Pincus Badder Work   ? Rehab Potential Good   ? PT Frequency 2x / week   ? PT Duration 4 weeks   ? PT Treatment/Interventions ADLs/Self Care Home Management;Cryotherapy;Electrical Stimulation;Iontophoresis 4mg /ml  Dexamethasone;Moist Heat;Traction;Ultrasound;Gait training;Stair training;Functional mobility training;Therapeutic activities;Therapeutic exercise;Balance training;Neuromuscular re-education;Patient/family education;Manual techniques;Passive range of motion;Dry needling;Energy conservation;Taping   ? PT Next Visit Plan work on general back and hip mobility, core strength, postural and lifting mechanics, assess traction   ? ?  ?  ? ?  ? ? ?Patient will benefit from skilled therapeutic intervention in order to improve the following deficits and impairments:  Decreased range of motion, Increased fascial restricitons, Increased muscle spasms, Decreased activity tolerance, Pain, Hypomobility, Impaired flexibility, Improper body mechanics, Decreased mobility, Decreased strength, Postural dysfunction ? ?Visit Diagnosis: ?Other low back pain ? ?Abnormal posture ? ?Other symptoms and signs involving the musculoskeletal system ? ? ? ? ?Problem List ?Patient Active Problem List  ? Diagnosis Date Noted  ? BPH (benign prostatic hypertrophy) 06/11/2013  ? Hypertension 06/11/2013  ? Obstructive sleep apnea 06/11/2013  ? Erectile dysfunction 06/11/2013  ? Urticaria 06/11/2013  ? ? ?08/11/2013, PTA ?08/12/2021, 8:41 AM ? ?[place] [facility] ?[address] ?[place], [place], [place] ?Phone: [phone]   Fax:  [phone] ? ?Name: Louis Barnes ?MRN: [name] ?Date of Birth: 03-Jan-1951 ? ? ? ?

## 2021-08-31 ENCOUNTER — Other Ambulatory Visit: Payer: Self-pay | Admitting: Physician Assistant

## 2021-08-31 ENCOUNTER — Ambulatory Visit
Admission: RE | Admit: 2021-08-31 | Discharge: 2021-08-31 | Disposition: A | Discharge status: Home / Self Care | Payer: BC Managed Care – PPO | Source: Ambulatory Visit | Attending: Physician Assistant | Admitting: Physician Assistant

## 2021-08-31 DIAGNOSIS — M86272 Subacute osteomyelitis, left ankle and foot: Secondary | ICD-10-CM | POA: Diagnosis not present

## 2021-08-31 DIAGNOSIS — L089 Local infection of the skin and subcutaneous tissue, unspecified: Secondary | ICD-10-CM | POA: Diagnosis not present

## 2021-09-01 ENCOUNTER — Other Ambulatory Visit: Payer: Self-pay | Admitting: Internal Medicine

## 2021-09-01 ENCOUNTER — Ambulatory Visit: Payer: Self-pay | Admitting: Podiatry

## 2021-09-01 DIAGNOSIS — M19072 Primary osteoarthritis, left ankle and foot: Secondary | ICD-10-CM | POA: Diagnosis not present

## 2021-09-01 DIAGNOSIS — M86272 Subacute osteomyelitis, left ankle and foot: Secondary | ICD-10-CM | POA: Diagnosis not present

## 2021-09-01 DIAGNOSIS — R600 Localized edema: Secondary | ICD-10-CM | POA: Diagnosis not present

## 2021-09-02 ENCOUNTER — Telehealth: Payer: Self-pay

## 2021-09-02 NOTE — Telephone Encounter (Signed)
Per Dr. Lajoyce Corners called pt and lm on vm to advise that we would like to see him in the office today. Dr. Valentina Lucks referred for possible osteo 5th toe and would like for pt to come in today to be seen. Will hold this message and try again.

## 2021-09-03 NOTE — Telephone Encounter (Signed)
Pt called has an appt on 09/07/21

## 2021-09-07 ENCOUNTER — Ambulatory Visit: Payer: BC Managed Care – PPO | Admitting: Orthopedic Surgery

## 2021-09-07 DIAGNOSIS — R7989 Other specified abnormal findings of blood chemistry: Secondary | ICD-10-CM | POA: Diagnosis not present

## 2021-09-07 DIAGNOSIS — L97521 Non-pressure chronic ulcer of other part of left foot limited to breakdown of skin: Secondary | ICD-10-CM

## 2021-09-07 DIAGNOSIS — R748 Abnormal levels of other serum enzymes: Secondary | ICD-10-CM | POA: Diagnosis not present

## 2021-09-08 ENCOUNTER — Encounter: Payer: Self-pay | Admitting: Orthopedic Surgery

## 2021-09-08 NOTE — Progress Notes (Signed)
Office Visit Note   Patient: Louis Barnes           Date of Birth: 09-11-1950           MRN: [id number] Visit Date: 09/07/2021              Requested by: Kirby Funk, MD [address] [place],  [place] [address] PCP: Kirby Funk, MD  Chief Complaint  Patient presents with   Left Foot - Wound Check    Left 5th toe osteomyelitis      HPI: Patient is a 71 year old gentleman is seen for initial evaluation for left foot with an ulceration in the fourth webspace with an MRI scan that shows osteomyelitis of the fifth toe.  Patient states he has been on Augmentin for 3 days.  Patient does not have a history of diabetes is currently not a smoker.  Patient's dermatologist had recommended peroxide betamethasone antibiotic ointment and powder.  Assessment & Plan: Visit Diagnoses:  1. Chronic ulcer of toe of left foot, limited to breakdown of skin (HCC)     Plan: Clinically patient has a interdigital fungal ulcer.  We will have patient wash the ulcer with soap and water daily continue with the Augmentin he will wick the wound open with the Vive sock and change daily.  Recommend stopping the betamethasone, antibiotic ointment, and powder.   Plan for hemoglobin A1c at follow-up.   Follow-Up Instructions: Return in about 1 week (around 09/14/2021).   Ortho Exam  Patient is alert, oriented, no adenopathy, well-dressed, normal affect, normal respiratory effort. Examination patient has a good pulse.  He has an ulcer in the fourth webspace with ulcerative changes consistent with a fungal infection.  There is no ascending cellulitis there is no sausage digit swelling of the toe no ulcerations on the toe.  Review of the MRI scan shows no abscess no evidence of osteomyelitis.  MRI images from [facility] and only report is available.  Reviewing patient's laboratory studies there are no values in the chart since 2012 on the epic system.  In 2012 patient's hemoglobin A1c value was  high.  Imaging: No results found. No images are attached to the encounter.  Labs: Lab Results  Component Value Date   HGBA1C (H) 04/27/2010    5.7 (NOTE)                                                                       According to the ADA Clinical Practice Recommendations for 2011, when HbA1c is used as a screening test:   >=6.5%   Diagnostic of Diabetes Mellitus           (if abnormal result  is confirmed)  5.7-6.4%   Increased risk of developing Diabetes Mellitus  References:Diagnosis and Classification of Diabetes Mellitus,Diabetes Care,2011,34(Suppl 1):S62-S69 and Standards of Medical Care in         Diabetes - 2011,Diabetes Care,2011,34  (Suppl 1):S11-S61.     No results found for: ALBUMIN, PREALBUMIN, CBC  No results found for: MG No results found for: VD25OH  No results found for: PREALBUMIN    Latest Ref Rng & Units 04/27/2010    6:00 AM 04/26/2010    8:39 PM  CBC EXTENDED  WBC 4.0 - 10.5 K/uL 5.8   4.3    RBC 4.22 - 5.81 MIL/uL 3.79   3.92    Hemoglobin 13.0 - 17.0 g/dL 51.8   84.1    HCT 66.0 - 52.0 % 35.0   36.1    Platelets 150 - 400 K/uL 208   202    NEUT# 1.7 - 7.7 K/uL  2.4    Lymph# 0.7 - 4.0 K/uL  1.3       There is no height or weight on file to calculate BMI.  Orders:  No orders of the defined types were placed in this encounter.  No orders of the defined types were placed in this encounter.    Procedures: No procedures performed  Clinical Data: No additional findings.  ROS:  All other systems negative, except as noted in the HPI. Review of Systems  Objective: Vital Signs: There were no vitals taken for this visit.  Specialty Comments:  No specialty comments available.  PMFS History: Patient Active Problem List   Diagnosis Date Noted   BPH (benign prostatic hypertrophy) 06/11/2013   Hypertension 06/11/2013   Obstructive sleep apnea 06/11/2013   Erectile dysfunction 06/11/2013   Urticaria 06/11/2013   Past Medical History:   Diagnosis Date   Allergy    GERD (gastroesophageal reflux disease)    Hyperlipidemia    Hypertension    Sleep apnea    cpap    Family History  Problem Relation Age of Onset   Colon cancer Paternal Grandmother        does not age of onset    Past Surgical History:  Procedure Laterality Date   NASAL SINUS SURGERY  2012   Social History   Occupational History   Not on file  Tobacco Use   Smoking status: Former    Types: Cigarettes    Quit date: 12/21/1969    Years since quitting: 51.7   Smokeless tobacco: Never  Substance and Sexual Activity   Alcohol use: Yes    Alcohol/week: 5.0 standard drinks    Types: 5 Glasses of wine per week   Drug use: No   Sexual activity: Not on file

## 2021-09-10 ENCOUNTER — Other Ambulatory Visit: Payer: BC Managed Care – PPO

## 2021-09-13 ENCOUNTER — Ambulatory Visit: Payer: BC Managed Care – PPO | Admitting: Orthopedic Surgery

## 2021-09-13 DIAGNOSIS — L97521 Non-pressure chronic ulcer of other part of left foot limited to breakdown of skin: Secondary | ICD-10-CM

## 2021-09-13 MED ORDER — DOXYCYCLINE HYCLATE 100 MG PO TABS: 100.0000 mg | ORAL_TABLET | Freq: Two times a day (BID) | ORAL | 0 refills | Status: DC

## 2021-09-14 ENCOUNTER — Encounter: Payer: Self-pay | Admitting: Orthopedic Surgery

## 2021-09-14 NOTE — Progress Notes (Signed)
Office Visit Note   Patient: Louis Barnes           Date of Birth: [birth year]           MRN: [id number] Visit Date: 09/13/2021              Requested by: Kirby Funk, MD [address] [place],  [place] [address] PCP: Kirby Funk, MD  Chief Complaint  Patient presents with   Left Foot - Follow-up    5th toe osteomyelitis       HPI: Patient is a 71 year old gentleman who presents in follow-up for ulceration and maceration fourth webspace between the fourth and fifth toes.  Patient has been packing the wound open with pieces of the Vive sock.  Patient states he has noticed a significant improvement.  Assessment & Plan: Visit Diagnoses:  1. Chronic ulcer of toe of left foot, limited to breakdown of skin (HCC)     Plan: Patient has just finished his prescription for Augmentin we will call in a prescription for doxycycline.  Continue to wick the wound open with the sock.  Follow-Up Instructions: Return in about 1 week (around 09/20/2021).   Ortho Exam  Patient is alert, oriented, no adenopathy, well-dressed, normal affect,.  Normal respiratory effort. Examination patient has a good dorsalis pedis pulse there is no swelling in the toe no ulcerations in the toe.  The ulcer in the fourth webspace has decreased maceration no drainage no ascending cellulitis  Imaging: No results found. No images are attached to the encounter.  Labs: Lab Results  Component Value Date   HGBA1C (H) 04/27/2010    5.7 (NOTE)                                                                       According to the ADA Clinical Practice Recommendations for 2011, when HbA1c is used as a screening test:   >=6.5%   Diagnostic of Diabetes Mellitus           (if abnormal result  is confirmed)  5.7-6.4%   Increased risk of developing Diabetes Mellitus  References:Diagnosis and Classification of Diabetes Mellitus,Diabetes Care,2011,34(Suppl 1):S62-S69 and Standards of Medical Care in          Diabetes - 2011,Diabetes Care,2011,34  (Suppl 1):S11-S61.     No results found for: ALBUMIN, PREALBUMIN, CBC  No results found for: MG No results found for: VD25OH  No results found for: PREALBUMIN    Latest Ref Rng & Units 04/27/2010    6:00 AM 04/26/2010    8:39 PM  CBC EXTENDED  WBC 4.0 - 10.5 K/uL 5.8   4.3    RBC 4.22 - 5.81 MIL/uL 3.79   3.92    Hemoglobin 13.0 - 17.0 g/dL 32.6   71.2    HCT 45.8 - 52.0 % 35.0   36.1    Platelets 150 - 400 K/uL 208   202    NEUT# 1.7 - 7.7 K/uL  2.4    Lymph# 0.7 - 4.0 K/uL  1.3       There is no height or weight on file to calculate BMI.  Orders:  No orders of the defined types were placed in this encounter.  Meds ordered this encounter  Medications   doxycycline (VIBRA-TABS) 100 MG tablet    Sig: Take 1 tablet (100 mg total) by mouth 2 (two) times daily.    Dispense:  30 tablet    Refill:  0     Procedures: No procedures performed  Clinical Data: No additional findings.  ROS:  All other systems negative, except as noted in the HPI. Review of Systems  Objective: Vital Signs: There were no vitals taken for this visit.  Specialty Comments:  No specialty comments available.  PMFS History: Patient Active Problem List   Diagnosis Date Noted   BPH (benign prostatic hypertrophy) 06/11/2013   Hypertension 06/11/2013   Obstructive sleep apnea 06/11/2013   Erectile dysfunction 06/11/2013   Urticaria 06/11/2013   Past Medical History:  Diagnosis Date   Allergy    GERD (gastroesophageal reflux disease)    Hyperlipidemia    Hypertension    Sleep apnea    cpap    Family History  Problem Relation Age of Onset   Colon cancer Paternal Grandmother        does not age of onset    Past Surgical History:  Procedure Laterality Date   NASAL SINUS SURGERY  2012   Social History   Occupational History   Not on file  Tobacco Use   Smoking status: Former    Types: Cigarettes    Quit date: 12/21/1969    Years since  quitting: 51.7   Smokeless tobacco: Never  Substance and Sexual Activity   Alcohol use: Yes    Alcohol/week: 5.0 standard drinks    Types: 5 Glasses of wine per week   Drug use: No   Sexual activity: Not on file

## 2021-09-20 ENCOUNTER — Ambulatory Visit: Payer: BC Managed Care – PPO | Admitting: Orthopedic Surgery

## 2021-09-20 DIAGNOSIS — L97521 Non-pressure chronic ulcer of other part of left foot limited to breakdown of skin: Secondary | ICD-10-CM | POA: Diagnosis not present

## 2021-09-21 DIAGNOSIS — Z6830 Body mass index (BMI) 30.0-30.9, adult: Secondary | ICD-10-CM | POA: Diagnosis not present

## 2021-09-21 DIAGNOSIS — M5416 Radiculopathy, lumbar region: Secondary | ICD-10-CM | POA: Diagnosis not present

## 2021-09-27 DIAGNOSIS — M48062 Spinal stenosis, lumbar region with neurogenic claudication: Secondary | ICD-10-CM | POA: Diagnosis not present

## 2021-09-28 ENCOUNTER — Encounter: Payer: Self-pay | Admitting: Orthopedic Surgery

## 2021-09-28 NOTE — Progress Notes (Signed)
Office Visit Note   Patient: Louis Barnes           Date of Birth: 1950-06-18           MRN: [id number] Visit Date: 09/20/2021              Requested by: Kirby Funk, MD [address] [place],  [place] [address] PCP: Kirby Funk, MD  Chief Complaint  Patient presents with   Left Foot - Follow-up      HPI: Patient is a 71 year old gentleman who presents follow-up for ulceration left foot fifth toe.  Assessment & Plan: Visit Diagnoses:  1. Chronic ulcer of toe of left foot, limited to breakdown of skin (HCC)     Plan: Continue with using the piece of sock in the webspace continue with doxycycline.  Follow-Up Instructions: Return in about 2 weeks (around 10/04/2021).   Ortho Exam  Patient is alert, oriented, no adenopathy, well-dressed, normal affect, normal respiratory effort. Examination patient still has some maceration in the fourth webspace there is no sausage digit swelling no cellulitis no drainage of the little toe.  Imaging: No results found. No images are attached to the encounter.  Labs: Lab Results  Component Value Date   HGBA1C (H) 04/27/2010    5.7 (NOTE)                                                                       According to the ADA Clinical Practice Recommendations for 2011, when HbA1c is used as a screening test:   >=6.5%   Diagnostic of Diabetes Mellitus           (if abnormal result  is confirmed)  5.7-6.4%   Increased risk of developing Diabetes Mellitus  References:Diagnosis and Classification of Diabetes Mellitus,Diabetes Care,2011,34(Suppl 1):S62-S69 and Standards of Medical Care in         Diabetes - 2011,Diabetes Care,2011,34  (Suppl 1):S11-S61.     No results found for: "ALBUMIN", "PREALBUMIN", "CBC"  No results found for: "MG" No results found for: "VD25OH"  No results found for: "PREALBUMIN"    Latest Ref Rng & Units 04/27/2010    6:00 AM 04/26/2010    8:39 PM  CBC EXTENDED  WBC 4.0 - 10.5 K/uL 5.8   4.3   RBC 4.22 - 5.81 MIL/uL 3.79  3.92   Hemoglobin 13.0 - 17.0 g/dL 76.1  60.7   HCT 37.1 - 52.0 % 35.0  36.1   Platelets 150 - 400 K/uL 208  202   NEUT# 1.7 - 7.7 K/uL  2.4   Lymph# 0.7 - 4.0 K/uL  1.3      There is no height or weight on file to calculate BMI.  Orders:  No orders of the defined types were placed in this encounter.  No orders of the defined types were placed in this encounter.    Procedures: No procedures performed  Clinical Data: No additional findings.  ROS:  All other systems negative, except as noted in the HPI. Review of Systems  Objective: Vital Signs: There were no vitals taken for this visit.  Specialty Comments:  No specialty comments available.  PMFS History: Patient Active Problem List   Diagnosis Date Noted  BPH (benign prostatic hypertrophy) 06/11/2013   Hypertension 06/11/2013   Obstructive sleep apnea 06/11/2013   Erectile dysfunction 06/11/2013   Urticaria 06/11/2013   Past Medical History:  Diagnosis Date   Allergy    GERD (gastroesophageal reflux disease)    Hyperlipidemia    Hypertension    Sleep apnea    cpap    Family History  Problem Relation Age of Onset   Colon cancer Paternal Grandmother        does not age of onset    Past Surgical History:  Procedure Laterality Date   NASAL SINUS SURGERY  2012   Social History   Occupational History   Not on file  Tobacco Use   Smoking status: Former    Types: Cigarettes    Quit date: 12/21/1969    Years since quitting: 51.8   Smokeless tobacco: Never  Substance and Sexual Activity   Alcohol use: Yes    Alcohol/week: 5.0 standard drinks of alcohol    Types: 5 Glasses of wine per week   Drug use: No   Sexual activity: Not on file

## 2021-10-04 ENCOUNTER — Ambulatory Visit: Payer: BC Managed Care – PPO | Admitting: Orthopedic Surgery

## 2021-10-04 ENCOUNTER — Encounter: Payer: Self-pay | Admitting: Orthopedic Surgery

## 2021-10-04 DIAGNOSIS — L97521 Non-pressure chronic ulcer of other part of left foot limited to breakdown of skin: Secondary | ICD-10-CM | POA: Diagnosis not present

## 2021-10-04 MED ORDER — SULFAMETHOXAZOLE-TRIMETHOPRIM 800-160 MG PO TABS: 1.0000 | ORAL_TABLET | Freq: Two times a day (BID) | ORAL | 0 refills | Status: DC

## 2021-10-06 ENCOUNTER — Telehealth: Payer: Self-pay | Admitting: Orthopedic Surgery

## 2021-10-06 MED ORDER — MUPIROCIN 2 % EX OINT: 1.0000 | TOPICAL_OINTMENT | Freq: Two times a day (BID) | CUTANEOUS | 3 refills | Status: DC

## 2021-10-06 NOTE — Telephone Encounter (Signed)
You note states that you gave this pt an rx for Bactroban to take with him at his visit but did not say anything about an antibiotic. Please see message below and advise.

## 2021-10-06 NOTE — Telephone Encounter (Signed)
Patient called stating he called the pharmacy and the Rx for the antibiotic is not showing  received at the pharmacy yet. The number to contact patient is [phone]

## 2021-10-06 NOTE — Addendum Note (Signed)
Addended by: Aldean Baker on: 10/06/2021 05:17 PM   Modules accepted: Orders

## 2021-10-07 ENCOUNTER — Other Ambulatory Visit: Payer: Self-pay

## 2021-10-07 ENCOUNTER — Telehealth: Payer: Self-pay | Admitting: Orthopedic Surgery

## 2021-10-07 MED ORDER — SULFAMETHOXAZOLE-TRIMETHOPRIM 800-160 MG PO TABS: 1.0000 | ORAL_TABLET | Freq: Two times a day (BID) | ORAL | 0 refills | Status: DC

## 2021-10-07 NOTE — Telephone Encounter (Signed)
Pharmacy updated.

## 2021-10-07 NOTE — Telephone Encounter (Signed)
Correction the abx that was given at time of appt was resubmit to the pharmacy.

## 2021-10-07 NOTE — Telephone Encounter (Signed)
Pt came in stating Dr Lajoyce Corners never sent his antibiotics. Checked the chart to see pt script went to [facility] [address]. Pt asked for the future please use [facility] [address] [place]

## 2021-10-19 DIAGNOSIS — K219 Gastro-esophageal reflux disease without esophagitis: Secondary | ICD-10-CM | POA: Diagnosis not present

## 2021-10-19 DIAGNOSIS — S91302A Unspecified open wound, left foot, initial encounter: Secondary | ICD-10-CM | POA: Diagnosis not present

## 2021-10-19 DIAGNOSIS — I10 Essential (primary) hypertension: Secondary | ICD-10-CM | POA: Diagnosis not present

## 2021-10-19 DIAGNOSIS — M5432 Sciatica, left side: Secondary | ICD-10-CM | POA: Diagnosis not present

## 2021-10-28 ENCOUNTER — Ambulatory Visit (INDEPENDENT_AMBULATORY_CARE_PROVIDER_SITE_OTHER): Payer: BC Managed Care – PPO | Admitting: Orthopedic Surgery

## 2021-10-28 DIAGNOSIS — L97521 Non-pressure chronic ulcer of other part of left foot limited to breakdown of skin: Secondary | ICD-10-CM | POA: Diagnosis not present

## 2021-11-09 ENCOUNTER — Encounter: Payer: Self-pay | Admitting: Orthopedic Surgery

## 2021-11-09 NOTE — Progress Notes (Signed)
Office Visit Note   Patient: Louis Barnes           Date of Birth: [birth year]           MRN: [id number] Visit Date: 10/28/2021              Requested by: Kirby Funk, MD [address] [place],  [place] [address] PCP: Kirby Funk, MD  Chief Complaint  Patient presents with   Left Foot - Follow-up      HPI: Patient is a 71 year old gentleman who presents in follow-up for ulcer fourth webspace fifth toe.  Patient has been using the Vive wear socks to wick away the moisture in the webspace.  Assessment & Plan: Visit Diagnoses:  1. Chronic ulcer of toe of left foot, limited to breakdown of skin (HCC)     Plan: Patient was given 1 more sock to use for wicking, continue with current wound care follow-up as needed  Follow-Up Instructions: Return if symptoms worsen or fail to improve.   Ortho Exam  Patient is alert, oriented, no adenopathy, well-dressed, normal affect, normal respiratory effort. Examination the ulcer fourth webspace fifth toe is healed patient has been using Bactroban and the Vive sock  Imaging: No results found. No images are attached to the encounter.  Labs: Lab Results  Component Value Date   HGBA1C (H) 04/27/2010    5.7 (NOTE)                                                                       According to the ADA Clinical Practice Recommendations for 2011, when HbA1c is used as a screening test:   >=6.5%   Diagnostic of Diabetes Mellitus           (if abnormal result  is confirmed)  5.7-6.4%   Increased risk of developing Diabetes Mellitus  References:Diagnosis and Classification of Diabetes Mellitus,Diabetes Care,2011,34(Suppl 1):S62-S69 and Standards of Medical Care in         Diabetes - 2011,Diabetes Care,2011,34  (Suppl 1):S11-S61.     No results found for: "ALBUMIN", "PREALBUMIN", "CBC"  No results found for: "MG" No results found for: "VD25OH"  No results found for: "PREALBUMIN"    Latest Ref Rng & Units 04/27/2010     6:00 AM 04/26/2010    8:39 PM  CBC EXTENDED  WBC 4.0 - 10.5 K/uL 5.8  4.3   RBC 4.22 - 5.81 MIL/uL 3.79  3.92   Hemoglobin 13.0 - 17.0 g/dL 37.9  02.4   HCT 09.7 - 52.0 % 35.0  36.1   Platelets 150 - 400 K/uL 208  202   NEUT# 1.7 - 7.7 K/uL  2.4   Lymph# 0.7 - 4.0 K/uL  1.3      There is no height or weight on file to calculate BMI.  Orders:  No orders of the defined types were placed in this encounter.  No orders of the defined types were placed in this encounter.    Procedures: No procedures performed  Clinical Data: No additional findings.  ROS:  All other systems negative, except as noted in the HPI. Review of Systems  Objective: Vital Signs: There were no vitals taken for this visit.  Specialty Comments:  No specialty comments available.  PMFS History: Patient Active Problem List   Diagnosis Date Noted   BPH (benign prostatic hypertrophy) 06/11/2013   Hypertension 06/11/2013   Obstructive sleep apnea 06/11/2013   Erectile dysfunction 06/11/2013   Urticaria 06/11/2013   Past Medical History:  Diagnosis Date   Allergy    GERD (gastroesophageal reflux disease)    Hyperlipidemia    Hypertension    Sleep apnea    cpap    Family History  Problem Relation Age of Onset   Colon cancer Paternal Grandmother        does not age of onset    Past Surgical History:  Procedure Laterality Date   NASAL SINUS SURGERY  2012   Social History   Occupational History   Not on file  Tobacco Use   Smoking status: Former    Types: Cigarettes    Quit date: 12/21/1969    Years since quitting: 51.9   Smokeless tobacco: Never  Substance and Sexual Activity   Alcohol use: Yes    Alcohol/week: 5.0 standard drinks of alcohol    Types: 5 Glasses of wine per week   Drug use: No   Sexual activity: Not on file

## 2021-12-08 DIAGNOSIS — M25562 Pain in left knee: Secondary | ICD-10-CM | POA: Diagnosis not present

## 2022-01-19 NOTE — Therapy (Signed)
OUTPATIENT PHYSICAL THERAPY THORACOLUMBAR EVALUATION   Patient Name: Louis Barnes MRN: [id number] DOB:[birth year], 71 y.o., male Today's Date: 01/20/2022   PT End of Session - 01/20/22 0833     Visit Number 1    Date for PT Re-Evaluation 03/20/22    Authorization Type [employer]    PT Start Time 0830    PT Stop Time 0915    PT Time Calculation (min) 45 min    Activity Tolerance Patient limited by pain    Behavior During Therapy [facility] for tasks assessed/performed             Past Medical History:  Diagnosis Date   Allergy    GERD (gastroesophageal reflux disease)    Hyperlipidemia    Hypertension    Sleep apnea    cpap   Past Surgical History:  Procedure Laterality Date   NASAL SINUS SURGERY  2012   Patient Active Problem List   Diagnosis Date Noted   BPH (benign prostatic hypertrophy) 06/11/2013   Hypertension 06/11/2013   Obstructive sleep apnea 06/11/2013   Erectile dysfunction 06/11/2013   Urticaria 06/11/2013    PCP: Lavone Orn  REFERRING PROVIDER: Sherley Bounds  REFERRING DIAG: lumbar radiculopathy  Rationale for Evaluation and Treatment Rehabilitation  THERAPY DIAG:  Other low back pain  Abnormal posture  Other symptoms and signs involving the musculoskeletal system  Radiculopathy, lumbar region  ONSET DATE: March 2023  SUBJECTIVE:                                                                                                                                                                                           SUBJECTIVE STATEMENT: Patient has been having in the low back and the leg for a few years, worse over time, he was seen here back in April with significant pain, he came one other time we did traction and he left saying it was a good thing, however we did not see him again.  He comes in much worse today with significant pain and difficulty walking, reports recently has had to use a cane due to left leg giving out.  Had a  fall PERTINENT HISTORY:  Has significant stenosis and lumbar herniation with nerve root compression  PAIN:  Are you having pain? Yes: NPRS scale: 9/10 Pain location: left low back, left buttock and left leg Pain description: ache Aggravating factors: any motions Relieving factors: leaning forward on a cane while sitting helps at best pain a 7/10   PRECAUTIONS: None  WEIGHT BEARING RESTRICTIONS: No  FALLS:  Has patient fallen in last 6 months? Yes. Number of falls 1  LIVING ENVIRONMENT:  Lives with: lives with their family Lives in: House/apartment Stairs: Yes: Internal: 15 steps; can reach both has to do one at a time and side ways Has following equipment at home: Single point cane  OCCUPATION: sitting, some travel  PLOF: Independent yardwork and housework  PATIENT GOALS: have less pain   OBJECTIVE:   DIAGNOSTIC FINDINGS:  Significant stenosis with central canal compression and nerve root compression from herniated disc  PATIENT SURVEYS:  FOTO 10 = 90% limitation  SCREENING FOR RED FLAGS: Bowel or bladder incontinence: No  COGNITION:  Overall cognitive status: Within functional limits for tasks assessed     SENSATION: WFL  MUSCLE LENGTH: Hamstrings: Right very limitied due to pain POSTURE: rounded shoulders, forward head, and decreased lumbar lordosis  PALPATION: Very tight and tender in the lumbar spine and into the left buttock  LUMBAR ROM:   AROM eval  Flexion Decreased 100%  Extension Decreased 100%  Right lateral flexion Decreased 100%  Left lateral flexion Decreased 100%  Right rotation   Left rotation    (Blank rows = not tested)  LOWER EXTREMITY MMT    Active  Right eval Left eval  Hip flexion  3+  Hip extension  3+  Hip abduction  4-  Hip adduction  4-  Hip internal rotation    Hip external rotation    Knee flexion  4-  Knee extension  4-  Ankle dorsiflexion  4-  Ankle plantarflexion    Ankle inversion    Ankle eversion      (Blank rows = not tested)  All motions and MMT increased LBP  LUMBAR SPECIAL TESTS:  All lumbar testing was positive due to pain  FUNCTIONAL TESTS:  Timed up and go (TUG): 72 seconds with SPC very slow and painful  GAIT: Distance walked: 50 feet Assistive device utilized: Single point cane Level of assistance: SBA Comments: very slow, very unsteady    TODAY'S TREATMENT:  [facility] Adult PT Treatment:                                                                                                                            DATE: 01/20/22 Education on the dx, our POC, home traction, heat, and sleft traction body mechanics    PATIENT EDUCATION:  Education details: POC Person educated: Patient Education method: Explanation Education comprehension: verbalized understanding   HOME EXERCISE PROGRAM: Sheet traction and self unweighting  ASSESSMENT:  CLINICAL IMPRESSION: Patient is a 71 y.o. male who was seen today for physical therapy evaluation and treatment for low back pain with lumbar radiculopathy.  He has had much worsening symptoms over the past few month, he reports that his left leg has been giving out and he had a fall, it appears that he is in tremendous pain and reports "I am at the end of my rope", "I tried to tough this out and I was wrong to do that"  All motions and strength were severely limited due  to pain. I am not sure with his current state that he can get moving and tolerate the therapy, I would like to try traction but at this point he is unable to tolerate   OBJECTIVE IMPAIRMENTS: Abnormal gait, cardiopulmonary status limiting activity, decreased activity tolerance, decreased mobility, difficulty walking, decreased ROM, decreased strength, increased muscle spasms, impaired flexibility, improper body mechanics, postural dysfunction, and pain.   REHAB POTENTIAL: Good  CLINICAL DECISION MAKING: Stable/uncomplicated  EVALUATION COMPLEXITY: Low   GOALS: Goals  reviewed with patient? Yes  SHORT TERM GOALS: Target date: 02/02/22  Independent with initial HEP Goal status: INITIAL  LONG TERM GOALS: Target date: 04/13/22  Understand posture and body mechanics Goal status: INITIAL  2.  Decrease pain 25% Goal status: INITIAL  3.  Increase lumbar ROM 25% Goal status: INITIAL  4.  Increase strength of the left LE to 4/5 Goal status: INITIAL    PLAN: PT FREQUENCY: 1-2x/week  PT DURATION: 6 weeks  PLANNED INTERVENTIONS: Therapeutic exercises, Therapeutic activity, Neuromuscular re-education, Balance training, Gait training, Patient/Family education, Self Care, Joint mobilization, Dry Needling, Electrical stimulation, Spinal mobilization, Cryotherapy, Moist heat, Traction, and Manual therapy.  PLAN FOR NEXT SESSION: would like to try some motions and and pain relief that we can   Sumner Boast, PT 01/20/2022, 8:34 AM

## 2022-01-20 ENCOUNTER — Encounter: Payer: Self-pay | Admitting: Physical Therapy

## 2022-01-20 ENCOUNTER — Ambulatory Visit: Payer: BC Managed Care – PPO | Attending: Neurological Surgery | Admitting: Physical Therapy

## 2022-01-20 DIAGNOSIS — M5416 Radiculopathy, lumbar region: Secondary | ICD-10-CM | POA: Insufficient documentation

## 2022-01-20 DIAGNOSIS — M5459 Other low back pain: Secondary | ICD-10-CM | POA: Insufficient documentation

## 2022-01-20 DIAGNOSIS — R29898 Other symptoms and signs involving the musculoskeletal system: Secondary | ICD-10-CM | POA: Diagnosis not present

## 2022-01-20 DIAGNOSIS — R293 Abnormal posture: Secondary | ICD-10-CM | POA: Diagnosis not present

## 2022-01-25 DIAGNOSIS — M5416 Radiculopathy, lumbar region: Secondary | ICD-10-CM | POA: Diagnosis not present

## 2022-01-31 ENCOUNTER — Ambulatory Visit: Payer: BC Managed Care – PPO

## 2022-03-10 DIAGNOSIS — Z6830 Body mass index (BMI) 30.0-30.9, adult: Secondary | ICD-10-CM | POA: Diagnosis not present

## 2022-03-10 DIAGNOSIS — M5416 Radiculopathy, lumbar region: Secondary | ICD-10-CM | POA: Diagnosis not present

## 2022-03-24 DIAGNOSIS — M5116 Intervertebral disc disorders with radiculopathy, lumbar region: Secondary | ICD-10-CM | POA: Diagnosis not present

## 2022-03-24 DIAGNOSIS — M5416 Radiculopathy, lumbar region: Secondary | ICD-10-CM | POA: Diagnosis not present

## 2022-03-24 DIAGNOSIS — M5117 Intervertebral disc disorders with radiculopathy, lumbosacral region: Secondary | ICD-10-CM | POA: Diagnosis not present

## 2022-03-24 DIAGNOSIS — Z6830 Body mass index (BMI) 30.0-30.9, adult: Secondary | ICD-10-CM | POA: Diagnosis not present

## 2022-03-24 DIAGNOSIS — M4727 Other spondylosis with radiculopathy, lumbosacral region: Secondary | ICD-10-CM | POA: Diagnosis not present

## 2022-03-24 DIAGNOSIS — M4726 Other spondylosis with radiculopathy, lumbar region: Secondary | ICD-10-CM | POA: Diagnosis not present

## 2022-03-29 DIAGNOSIS — M5416 Radiculopathy, lumbar region: Secondary | ICD-10-CM | POA: Diagnosis not present

## 2022-05-13 DIAGNOSIS — M5416 Radiculopathy, lumbar region: Secondary | ICD-10-CM | POA: Diagnosis not present

## 2022-05-16 DIAGNOSIS — M48061 Spinal stenosis, lumbar region without neurogenic claudication: Secondary | ICD-10-CM | POA: Diagnosis not present

## 2022-05-16 DIAGNOSIS — M5116 Intervertebral disc disorders with radiculopathy, lumbar region: Secondary | ICD-10-CM | POA: Diagnosis not present

## 2022-05-16 DIAGNOSIS — M5416 Radiculopathy, lumbar region: Secondary | ICD-10-CM | POA: Diagnosis not present

## 2022-05-16 DIAGNOSIS — M48062 Spinal stenosis, lumbar region with neurogenic claudication: Secondary | ICD-10-CM | POA: Diagnosis not present

## 2022-06-20 DIAGNOSIS — Z0000 Encounter for general adult medical examination without abnormal findings: Secondary | ICD-10-CM | POA: Diagnosis not present

## 2022-06-20 DIAGNOSIS — M5432 Sciatica, left side: Secondary | ICD-10-CM | POA: Diagnosis not present

## 2022-06-20 DIAGNOSIS — N400 Enlarged prostate without lower urinary tract symptoms: Secondary | ICD-10-CM | POA: Diagnosis not present

## 2022-06-20 DIAGNOSIS — G8929 Other chronic pain: Secondary | ICD-10-CM | POA: Diagnosis not present

## 2022-06-20 DIAGNOSIS — I10 Essential (primary) hypertension: Secondary | ICD-10-CM | POA: Diagnosis not present

## 2022-06-20 DIAGNOSIS — E7800 Pure hypercholesterolemia, unspecified: Secondary | ICD-10-CM | POA: Diagnosis not present

## 2022-06-20 DIAGNOSIS — K219 Gastro-esophageal reflux disease without esophagitis: Secondary | ICD-10-CM | POA: Diagnosis not present

## 2022-06-20 DIAGNOSIS — Z23 Encounter for immunization: Secondary | ICD-10-CM | POA: Diagnosis not present

## 2022-06-20 DIAGNOSIS — Z6830 Body mass index (BMI) 30.0-30.9, adult: Secondary | ICD-10-CM | POA: Diagnosis not present

## 2022-06-20 DIAGNOSIS — Z1331 Encounter for screening for depression: Secondary | ICD-10-CM | POA: Diagnosis not present

## 2022-06-20 DIAGNOSIS — Z79899 Other long term (current) drug therapy: Secondary | ICD-10-CM | POA: Diagnosis not present

## 2022-06-20 DIAGNOSIS — Z125 Encounter for screening for malignant neoplasm of prostate: Secondary | ICD-10-CM | POA: Diagnosis not present

## 2022-06-28 DIAGNOSIS — M5416 Radiculopathy, lumbar region: Secondary | ICD-10-CM | POA: Diagnosis not present

## 2022-06-28 DIAGNOSIS — Z6831 Body mass index (BMI) 31.0-31.9, adult: Secondary | ICD-10-CM | POA: Diagnosis not present

## 2022-07-06 IMAGING — DX DG CHEST 2V
2 series · 2 of 2 positions shown · non-contrast
Comparison: 04/26/2010

CLINICAL DATA: Dyspnea on exertion

EXAM:
CHEST - 2 VIEW

[dg chest 2 view (1 of 2)]
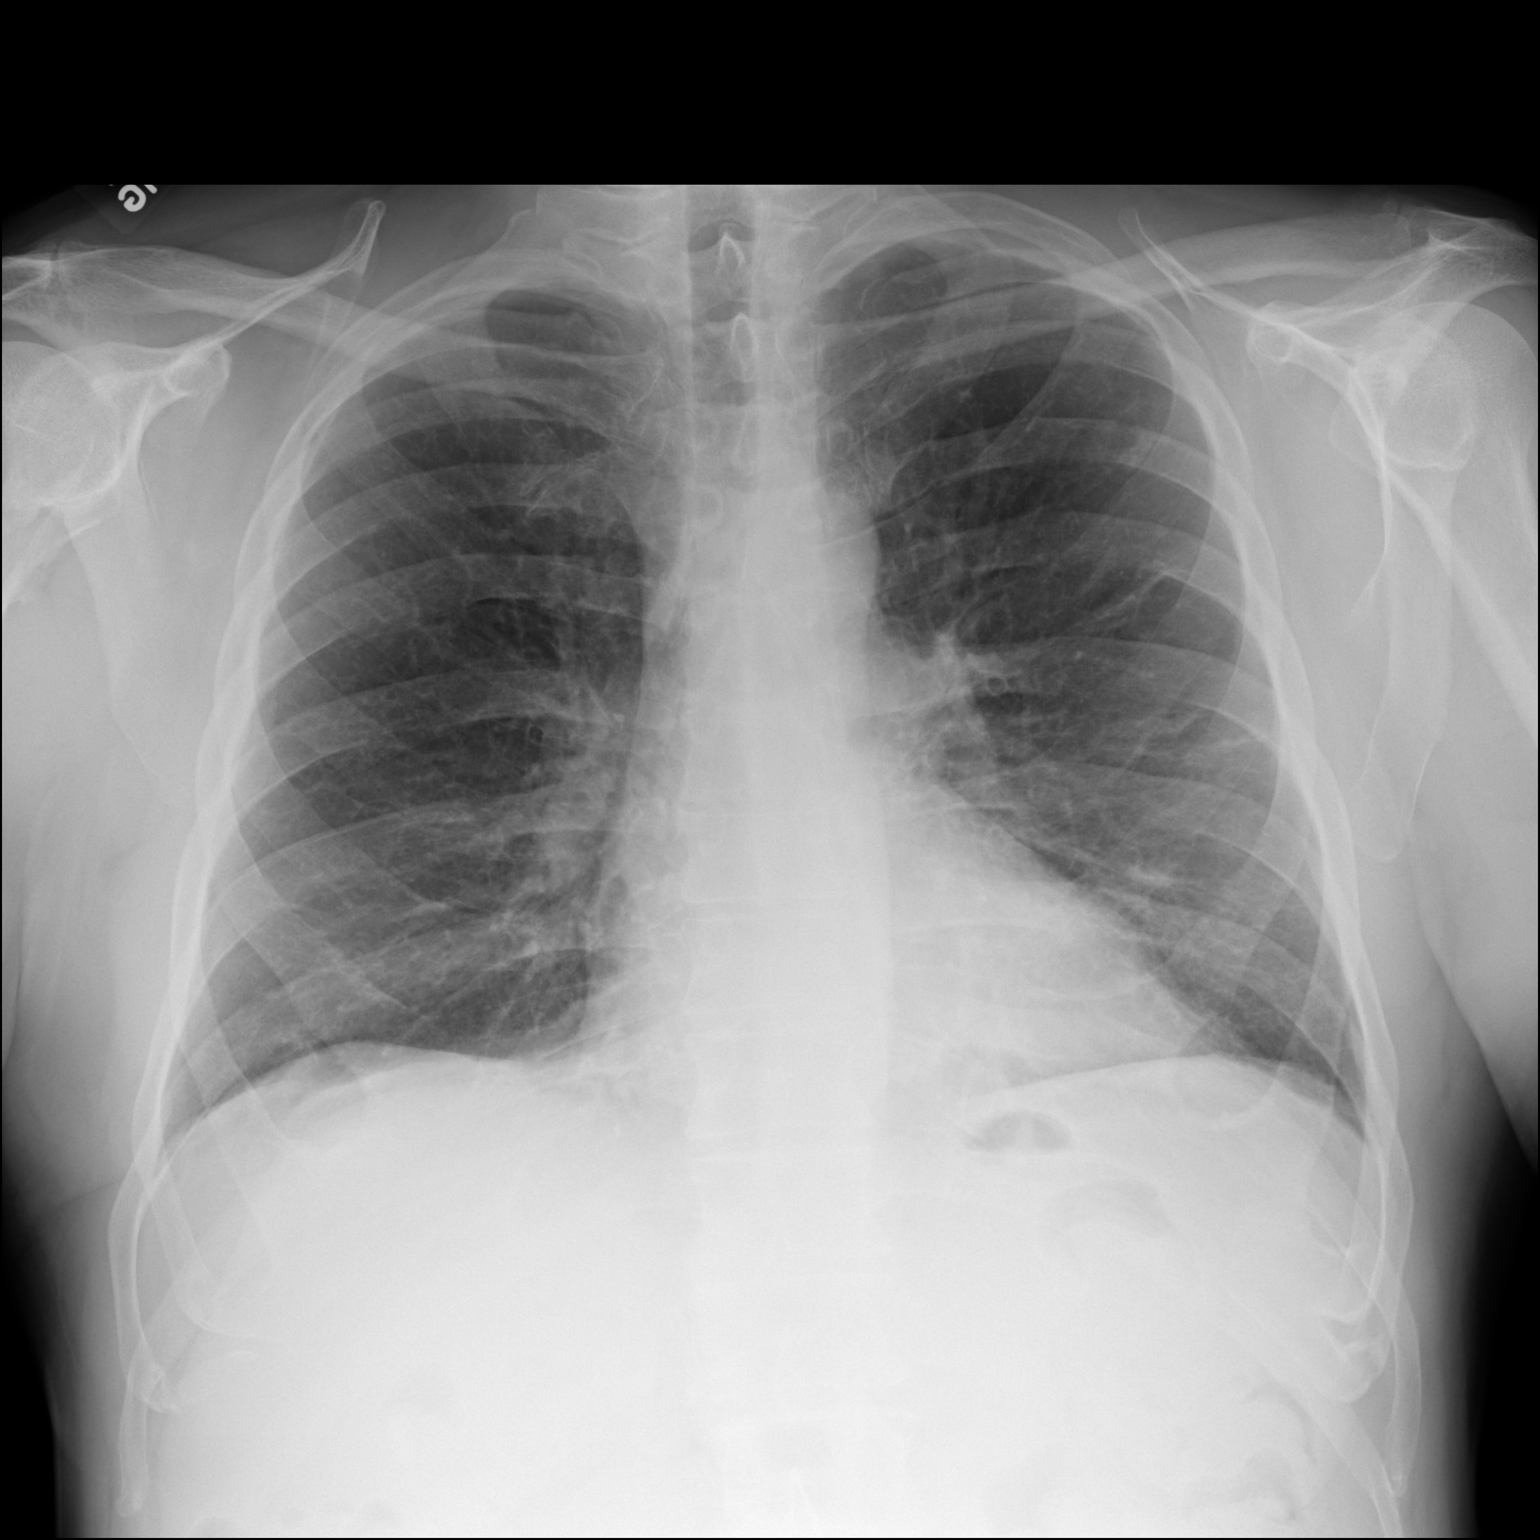

[dg chest 2 view (2 of 2)]
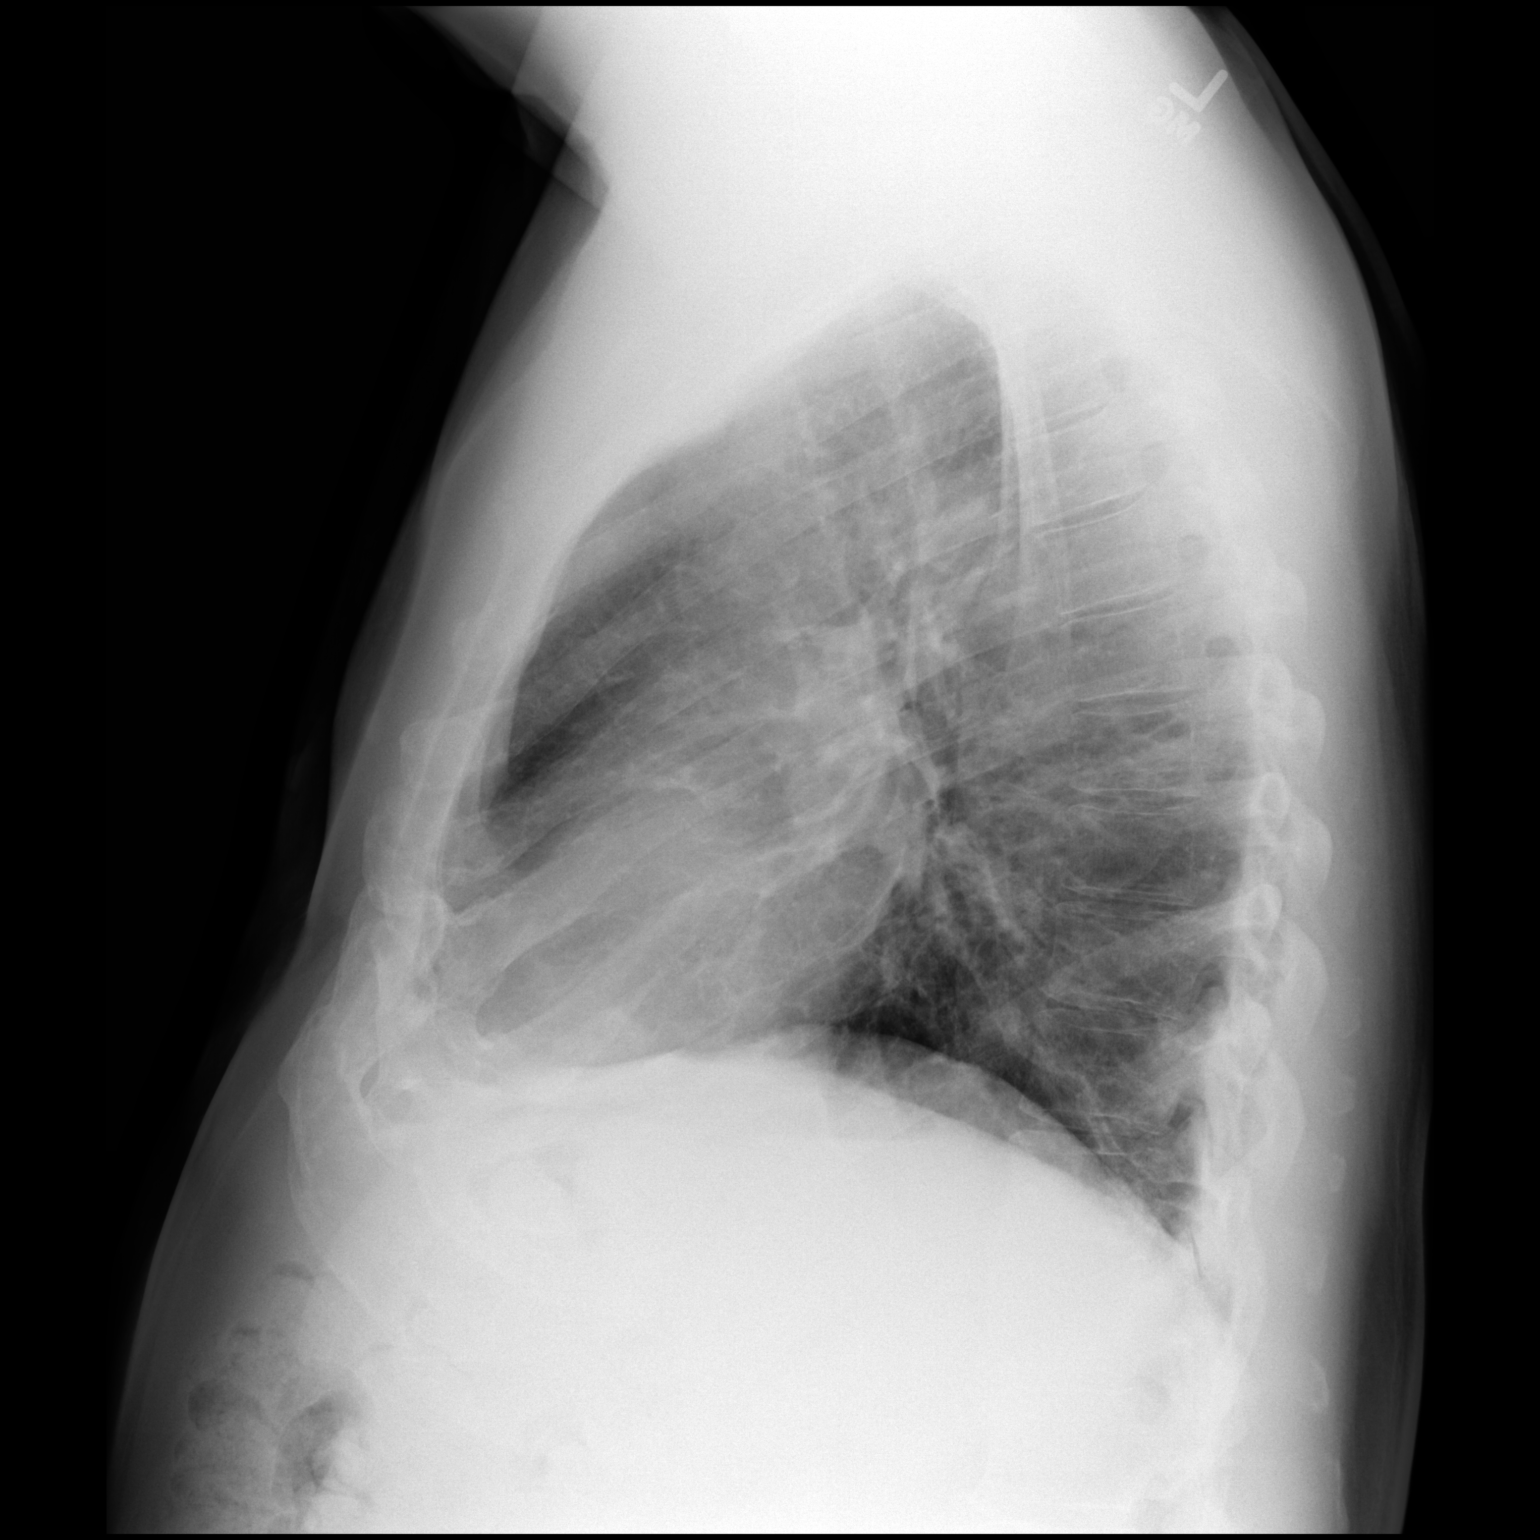

[2 of 2 positions shown; findings below may reference images not displayed]

FINDINGS: Linear atelectasis or scarring in the left mid to lower lung. No
consolidation or effusion. Normal heart size. No pneumothorax.
IMPRESSION: Linear atelectasis or scarring in the left mid to lower lung.

## 2022-09-01 DIAGNOSIS — G4733 Obstructive sleep apnea (adult) (pediatric): Secondary | ICD-10-CM | POA: Diagnosis not present

## 2022-10-02 DIAGNOSIS — G4733 Obstructive sleep apnea (adult) (pediatric): Secondary | ICD-10-CM | POA: Diagnosis not present

## 2022-10-06 DIAGNOSIS — L538 Other specified erythematous conditions: Secondary | ICD-10-CM | POA: Diagnosis not present

## 2022-10-06 DIAGNOSIS — D225 Melanocytic nevi of trunk: Secondary | ICD-10-CM | POA: Diagnosis not present

## 2022-10-06 DIAGNOSIS — L814 Other melanin hyperpigmentation: Secondary | ICD-10-CM | POA: Diagnosis not present

## 2022-10-06 DIAGNOSIS — L820 Inflamed seborrheic keratosis: Secondary | ICD-10-CM | POA: Diagnosis not present

## 2022-10-06 DIAGNOSIS — L570 Actinic keratosis: Secondary | ICD-10-CM | POA: Diagnosis not present

## 2022-10-06 DIAGNOSIS — D492 Neoplasm of unspecified behavior of bone, soft tissue, and skin: Secondary | ICD-10-CM | POA: Diagnosis not present

## 2022-10-06 DIAGNOSIS — L821 Other seborrheic keratosis: Secondary | ICD-10-CM | POA: Diagnosis not present

## 2022-11-01 DIAGNOSIS — G4733 Obstructive sleep apnea (adult) (pediatric): Secondary | ICD-10-CM | POA: Diagnosis not present

## 2022-11-23 DIAGNOSIS — E7800 Pure hypercholesterolemia, unspecified: Secondary | ICD-10-CM | POA: Diagnosis not present

## 2022-11-23 DIAGNOSIS — K219 Gastro-esophageal reflux disease without esophagitis: Secondary | ICD-10-CM | POA: Diagnosis not present

## 2022-11-23 DIAGNOSIS — G4733 Obstructive sleep apnea (adult) (pediatric): Secondary | ICD-10-CM | POA: Diagnosis not present

## 2022-11-23 DIAGNOSIS — N183 Chronic kidney disease, stage 3 unspecified: Secondary | ICD-10-CM | POA: Diagnosis not present

## 2022-11-23 DIAGNOSIS — G4700 Insomnia, unspecified: Secondary | ICD-10-CM | POA: Diagnosis not present

## 2022-11-23 DIAGNOSIS — I10 Essential (primary) hypertension: Secondary | ICD-10-CM | POA: Diagnosis not present

## 2022-11-23 DIAGNOSIS — G8929 Other chronic pain: Secondary | ICD-10-CM | POA: Diagnosis not present

## 2022-11-23 DIAGNOSIS — M5432 Sciatica, left side: Secondary | ICD-10-CM | POA: Diagnosis not present

## 2022-12-02 DIAGNOSIS — G4733 Obstructive sleep apnea (adult) (pediatric): Secondary | ICD-10-CM | POA: Diagnosis not present

## 2023-01-02 DIAGNOSIS — G4733 Obstructive sleep apnea (adult) (pediatric): Secondary | ICD-10-CM | POA: Diagnosis not present

## 2023-01-11 DIAGNOSIS — G4733 Obstructive sleep apnea (adult) (pediatric): Secondary | ICD-10-CM | POA: Diagnosis not present

## 2023-01-13 DIAGNOSIS — M1711 Unilateral primary osteoarthritis, right knee: Secondary | ICD-10-CM | POA: Diagnosis not present

## 2023-01-13 DIAGNOSIS — M170 Bilateral primary osteoarthritis of knee: Secondary | ICD-10-CM | POA: Diagnosis not present

## 2023-01-13 DIAGNOSIS — M1712 Unilateral primary osteoarthritis, left knee: Secondary | ICD-10-CM | POA: Diagnosis not present

## 2023-01-16 ENCOUNTER — Ambulatory Visit (INDEPENDENT_AMBULATORY_CARE_PROVIDER_SITE_OTHER): Payer: BC Managed Care – PPO

## 2023-01-16 ENCOUNTER — Ambulatory Visit (INDEPENDENT_AMBULATORY_CARE_PROVIDER_SITE_OTHER): Payer: Medicare HMO | Admitting: Podiatry

## 2023-01-16 ENCOUNTER — Encounter: Payer: Self-pay | Admitting: Podiatry

## 2023-01-16 DIAGNOSIS — M7752 Other enthesopathy of left foot: Secondary | ICD-10-CM

## 2023-01-16 DIAGNOSIS — M79672 Pain in left foot: Secondary | ICD-10-CM

## 2023-01-16 DIAGNOSIS — M205X2 Other deformities of toe(s) (acquired), left foot: Secondary | ICD-10-CM | POA: Diagnosis not present

## 2023-01-16 MED ORDER — TRIAMCINOLONE ACETONIDE 10 MG/ML IJ SUSP
10.0000 mg | Freq: Once | INTRAMUSCULAR | Status: AC
  Administered 2023-01-16: 10 mg via INTRA_ARTICULAR

## 2023-01-16 NOTE — Progress Notes (Signed)
Subjective:   Patient ID: Louis Barnes, male   DOB: 72 y.o.   MRN: [id number]   HPI Patient presents stating he has been developing a lot of pain in his big toe joint left and he does not remember specific injury but it has been sore.  States that he has tried shoe gear modifications reduced activity but is very active and does not smoke   Review of Systems  All other systems reviewed and are negative.       Objective:  Physical Exam Vitals and nursing note reviewed.  Constitutional:      Appearance: He is well-developed.  Pulmonary:     Effort: Pulmonary effort is normal.  Musculoskeletal:        General: Normal range of motion.  Skin:    General: Skin is warm.  Neurological:     Mental Status: He is alert.     Neurovascular status intact muscle strength found to be adequate range of motion within normal limits with patient found to have inflammation fluid of the first MPJ left pain on the lateral side of the joint surface no reduced motion no crepitus of the joint was noted.  Good digital perfusion well-oriented x 3     Assessment:  Probability for some form of functional hallux limitus possible structural hallux limitus deformity that is creating the pain that the patient is experiencing     Plan:  H&P x-rays reviewed discussed at this point I did a periarticular injection around the first MPJ left 3 mg dexamethasone Kenalog 5 mg Xylocaine advised on rigid bottom shoes and reappoint as symptoms indicate may require orthotics may require immobilization  X-rays were negative for signs of loss of joint space or other indications of pathology

## 2023-02-01 DIAGNOSIS — G4733 Obstructive sleep apnea (adult) (pediatric): Secondary | ICD-10-CM | POA: Diagnosis not present

## 2023-02-11 DIAGNOSIS — G4733 Obstructive sleep apnea (adult) (pediatric): Secondary | ICD-10-CM | POA: Diagnosis not present

## 2023-02-13 DIAGNOSIS — M1712 Unilateral primary osteoarthritis, left knee: Secondary | ICD-10-CM | POA: Diagnosis not present

## 2023-02-22 DIAGNOSIS — M79672 Pain in left foot: Secondary | ICD-10-CM | POA: Diagnosis not present

## 2023-02-25 DIAGNOSIS — M25561 Pain in right knee: Secondary | ICD-10-CM | POA: Diagnosis not present

## 2023-03-01 DIAGNOSIS — M79672 Pain in left foot: Secondary | ICD-10-CM | POA: Diagnosis not present

## 2023-03-04 DIAGNOSIS — G4733 Obstructive sleep apnea (adult) (pediatric): Secondary | ICD-10-CM | POA: Diagnosis not present

## 2023-03-06 DIAGNOSIS — L570 Actinic keratosis: Secondary | ICD-10-CM | POA: Diagnosis not present

## 2023-03-06 DIAGNOSIS — L538 Other specified erythematous conditions: Secondary | ICD-10-CM | POA: Diagnosis not present

## 2023-03-06 DIAGNOSIS — L820 Inflamed seborrheic keratosis: Secondary | ICD-10-CM | POA: Diagnosis not present

## 2023-03-06 DIAGNOSIS — D492 Neoplasm of unspecified behavior of bone, soft tissue, and skin: Secondary | ICD-10-CM | POA: Diagnosis not present

## 2023-03-13 DIAGNOSIS — G4733 Obstructive sleep apnea (adult) (pediatric): Secondary | ICD-10-CM | POA: Diagnosis not present

## 2023-03-22 DIAGNOSIS — M25561 Pain in right knee: Secondary | ICD-10-CM | POA: Diagnosis not present

## 2023-03-24 DIAGNOSIS — M2041 Other hammer toe(s) (acquired), right foot: Secondary | ICD-10-CM | POA: Diagnosis not present

## 2023-03-28 DIAGNOSIS — J019 Acute sinusitis, unspecified: Secondary | ICD-10-CM | POA: Diagnosis not present

## 2023-03-28 DIAGNOSIS — R0981 Nasal congestion: Secondary | ICD-10-CM | POA: Diagnosis not present

## 2023-04-01 DIAGNOSIS — R69 Illness, unspecified: Secondary | ICD-10-CM | POA: Diagnosis not present

## 2023-04-03 DIAGNOSIS — G4733 Obstructive sleep apnea (adult) (pediatric): Secondary | ICD-10-CM | POA: Diagnosis not present

## 2023-04-06 DIAGNOSIS — J209 Acute bronchitis, unspecified: Secondary | ICD-10-CM | POA: Diagnosis not present

## 2023-04-06 DIAGNOSIS — R052 Subacute cough: Secondary | ICD-10-CM | POA: Diagnosis not present

## 2023-04-11 DIAGNOSIS — R059 Cough, unspecified: Secondary | ICD-10-CM | POA: Diagnosis not present

## 2023-04-11 DIAGNOSIS — R0602 Shortness of breath: Secondary | ICD-10-CM | POA: Diagnosis not present

## 2023-04-11 DIAGNOSIS — R5383 Other fatigue: Secondary | ICD-10-CM | POA: Diagnosis not present

## 2023-04-11 DIAGNOSIS — G4733 Obstructive sleep apnea (adult) (pediatric): Secondary | ICD-10-CM | POA: Diagnosis not present

## 2023-04-26 ENCOUNTER — Telehealth: Payer: Self-pay | Admitting: Internal Medicine

## 2023-04-26 NOTE — Telephone Encounter (Signed)
 LVM for patient  to call and discuss rescheduling the 05/23/23 3:45 pm appointment with Dr. Lajean Pike not in the office

## 2023-04-27 ENCOUNTER — Encounter: Payer: Self-pay | Admitting: Internal Medicine

## 2023-04-27 NOTE — Telephone Encounter (Signed)
LVM for patient to call and discuss rescheduling the 05/23/23 3:45 pm appointment with Dr. Zada Girt is out of the office---will also mail letter requesting he call to reschedule

## 2023-05-23 ENCOUNTER — Institutional Professional Consult (permissible substitution): Payer: BC Managed Care – PPO | Admitting: Internal Medicine

## 2023-06-28 ENCOUNTER — Institutional Professional Consult (permissible substitution): Payer: BC Managed Care – PPO | Admitting: Pulmonary Disease

## 2023-07-07 ENCOUNTER — Other Ambulatory Visit: Payer: Self-pay | Admitting: Neurological Surgery

## 2023-07-07 DIAGNOSIS — M5416 Radiculopathy, lumbar region: Secondary | ICD-10-CM

## 2023-07-17 ENCOUNTER — Ambulatory Visit
Admission: RE | Admit: 2023-07-17 | Discharge: 2023-07-17 | Disposition: A | Discharge status: Home / Self Care | Source: Ambulatory Visit | Attending: Neurological Surgery | Admitting: Neurological Surgery

## 2023-07-17 DIAGNOSIS — M5416 Radiculopathy, lumbar region: Secondary | ICD-10-CM

## 2023-07-19 ENCOUNTER — Other Ambulatory Visit: Payer: Self-pay | Admitting: Student

## 2023-07-19 DIAGNOSIS — M5416 Radiculopathy, lumbar region: Secondary | ICD-10-CM

## 2023-07-24 NOTE — Discharge Instructions (Signed)

## 2023-07-25 ENCOUNTER — Ambulatory Visit
Admission: RE | Admit: 2023-07-25 | Discharge: 2023-07-25 | Disposition: A | Discharge status: Home / Self Care | Source: Ambulatory Visit | Attending: Student | Admitting: Student

## 2023-07-25 DIAGNOSIS — M5416 Radiculopathy, lumbar region: Secondary | ICD-10-CM

## 2023-07-25 MED ORDER — METHYLPREDNISOLONE ACETATE 40 MG/ML INJ SUSP (RADIOLOG
80.0000 mg | Freq: Once | INTRAMUSCULAR | Status: AC
  Administered 2023-07-25: 80 mg via EPIDURAL

## 2023-07-25 MED ORDER — IOPAMIDOL (ISOVUE-M 200) INJECTION 41%
1.0000 mL | Freq: Once | INTRAMUSCULAR | Status: AC
  Administered 2023-07-25: 1 mL via EPIDURAL

## 2023-08-28 ENCOUNTER — Ambulatory Visit: Admitting: Podiatry

## 2023-08-28 ENCOUNTER — Encounter: Payer: Self-pay | Admitting: Podiatry

## 2023-08-28 VITALS — Ht 70.0 in | Wt 213.0 lb

## 2023-08-28 DIAGNOSIS — M7752 Other enthesopathy of left foot: Secondary | ICD-10-CM | POA: Diagnosis not present

## 2023-08-28 MED ORDER — TRIAMCINOLONE ACETONIDE 10 MG/ML IJ SUSP
10.0000 mg | Freq: Once | INTRAMUSCULAR | Status: AC
  Administered 2023-08-28: 10 mg via INTRA_ARTICULAR

## 2023-08-29 ENCOUNTER — Other Ambulatory Visit: Payer: Self-pay | Admitting: Neurological Surgery

## 2023-08-29 DIAGNOSIS — M5416 Radiculopathy, lumbar region: Secondary | ICD-10-CM

## 2023-09-08 NOTE — Discharge Instructions (Signed)

## 2023-09-11 ENCOUNTER — Ambulatory Visit
Admission: RE | Admit: 2023-09-11 | Discharge: 2023-09-11 | Disposition: A | Discharge status: Home / Self Care | Source: Ambulatory Visit | Attending: Neurological Surgery | Admitting: Neurological Surgery

## 2023-09-11 DIAGNOSIS — M5416 Radiculopathy, lumbar region: Secondary | ICD-10-CM

## 2023-09-11 MED ORDER — IOPAMIDOL (ISOVUE-M 200) INJECTION 41%
1.0000 mL | Freq: Once | INTRAMUSCULAR | Status: AC
  Administered 2023-09-11: 1 mL via EPIDURAL

## 2023-09-11 MED ORDER — METHYLPREDNISOLONE ACETATE 40 MG/ML INJ SUSP (RADIOLOG
80.0000 mg | Freq: Once | INTRAMUSCULAR | Status: AC
  Administered 2023-09-11: 80 mg via EPIDURAL

## 2023-09-18 ENCOUNTER — Other Ambulatory Visit

## 2023-09-21 NOTE — Progress Notes (Signed)
 Subjective:   Patient ID: Louis Barnes, male   DOB: 73 y.o.   MRN: [id number]   HPI Patient presents stating the left ankle has been bothering me again but it did do well for an extended period of time   ROS      Objective:  Physical Exam  Neuro ocular status intact inflammation pain of the sinus tarsi left fluid buildup that did very well for at least 7 months     Assessment:  Inflammatory capsulitis of the left sinus tarsi     Plan:  H&P reviewed sterile prep injected the sinus tarsi left 3 mg Kenalog  5 mg Xylocaine applied sterile dressing reappoint as needed

## 2023-10-30 ENCOUNTER — Other Ambulatory Visit: Payer: Self-pay | Admitting: Neurological Surgery

## 2023-11-24 NOTE — Pre-Procedure Instructions (Signed)
 Surgical Instructions   Your procedure is scheduled on November 29, 2023. Report to [facility] Main Entrance A at 11:00 A.M., then check in with the Admitting office. Any questions or running late day of surgery: call [phone]  Questions prior to your surgery date: call [phone], Monday-Friday, 8am-4pm. If you experience any cold or flu symptoms such as cough, fever, chills, shortness of breath, etc. between now and your scheduled surgery, please notify us  at the above number.     Remember:  Do not eat after midnight the night before your surgery  You may drink clear liquids until 10:00 AM the morning of your surgery.   Clear liquids allowed are: Water, Non-Citrus Juices (without pulp), Carbonated Beverages, Clear Tea (no milk, honey, etc.), Black Coffee Only (NO MILK, CREAM OR POWDERED CREAMER of any kind), and Gatorade.    Take these medicines the morning of surgery with A SIP OF WATER: amoxicillin-clavulanate (AUGMENTIN)  atorvastatin (LIPITOR)  doxycycline  (VIBRA -TABS)  omeprazole (PRILOSEC)  sertraline (ZOLOFT)  sulfamethoxazole -trimethoprim  (BACTRIM  DS)    Follow your surgeon's instructions on when to stop Aspirin.  If no instructions were given by your surgeon then you will need to call the office to get those instructions.     One week prior to surgery, STOP taking any Aleve, Naproxen, Ibuprofen, Motrin, Advil, Goody's, BC's, all herbal medications, fish oil, and non-prescription vitamins. This includes your medication: diclofenac (VOLTAREN) tablet                      Do NOT Smoke (Tobacco/Vaping) for 24 hours prior to your procedure.  If you use a CPAP at night, you may bring your mask/headgear for your overnight stay.   You will be asked to remove any contacts, glasses, piercing's, hearing aid's, dentures/partials prior to surgery. Please bring cases for these items if needed.    Patients discharged the day of surgery will not be allowed to drive home, and  someone needs to stay with them for 24 hours.  SURGICAL WAITING ROOM VISITATION Patients may have no more than 2 support people in the waiting area - these visitors may rotate.   Pre-op nurse will coordinate an appropriate time for 1 ADULT support person, who may not rotate, to accompany patient in pre-op.  Children under the age of 63 must have an adult with them who is not the patient and must remain in the main waiting area with an adult.  If the patient needs to stay at the hospital during part of their recovery, the visitor guidelines for inpatient rooms apply.  Please refer to the [facility] website for the visitor guidelines for any additional information.   If you received a COVID test during your pre-op visit  it is requested that you wear a mask when out in public, stay away from anyone that may not be feeling well and notify your surgeon if you develop symptoms. If you have been in contact with anyone that has tested positive in the last 10 days please notify you surgeon.      Pre-operative 5 CHG Bathing Instructions   You can play a key role in reducing the risk of infection after surgery. Your skin needs to be as free of germs as possible. You can reduce the number of germs on your skin by washing with CHG (chlorhexidine  gluconate) soap before surgery. CHG is an antiseptic soap that kills germs and continues to kill germs even after washing.   DO NOT use if  you have an allergy to chlorhexidine /CHG or antibacterial soaps. If your skin becomes reddened or irritated, stop using the CHG and notify one of our RNs at [phone].   Please shower with the CHG soap starting 4 days before surgery using the following schedule:     Please keep in mind the following:  DO NOT shave, including legs and underarms, starting the day of your first shower.   You may shave your face at any point before/day of surgery.  Place clean sheets on your bed the day you start using CHG soap. Use a  clean washcloth (not used since being washed) for each shower. DO NOT sleep with pets once you start using the CHG.   CHG Shower Instructions:  Wash your face and private area with normal soap. If you choose to wash your hair, wash first with your normal shampoo.  After you use shampoo/soap, rinse your hair and body thoroughly to remove shampoo/soap residue.  Turn the water OFF and apply about 3 tablespoons (45 ml) of CHG soap to a CLEAN washcloth.  Apply CHG soap ONLY FROM YOUR NECK DOWN TO YOUR TOES (washing for 3-5 minutes)  DO NOT use CHG soap on face, private areas, open wounds, or sores.  Pay special attention to the area where your surgery is being performed.  If you are having back surgery, having someone wash your back for you may be helpful. Wait 2 minutes after CHG soap is applied, then you may rinse off the CHG soap.  Pat dry with a clean towel  Put on clean clothes/pajamas   If you choose to wear lotion, please use ONLY the CHG-compatible lotions that are listed below.  Additional instructions for the day of surgery: DO NOT APPLY any lotions, deodorants, cologne, or perfumes.   Do not bring valuables to the hospital. [facility] is not responsible for any belongings/valuables. Do not wear nail polish, gel polish, artificial nails, or any other type of covering on natural nails (fingers and toes) Do not wear jewelry or makeup Put on clean/comfortable clothes.  Please brush your teeth.  Ask your nurse before applying any prescription medications to the skin.     CHG Compatible Lotions   Aveeno Moisturizing lotion  Cetaphil Moisturizing Cream  Cetaphil Moisturizing Lotion  Clairol Herbal Essence Moisturizing Lotion, Dry Skin  Clairol Herbal Essence Moisturizing Lotion, Extra Dry Skin  Clairol Herbal Essence Moisturizing Lotion, Normal Skin  Curel Age Defying Therapeutic Moisturizing Lotion with Alpha Hydroxy  Curel Extreme Care Body Lotion  Curel Soothing Hands  Moisturizing Hand Lotion  Curel Therapeutic Moisturizing Cream, Fragrance-Free  Curel Therapeutic Moisturizing Lotion, Fragrance-Free  Curel Therapeutic Moisturizing Lotion, Original Formula  Eucerin Daily Replenishing Lotion  Eucerin Dry Skin Therapy Plus Alpha Hydroxy Crme  Eucerin Dry Skin Therapy Plus Alpha Hydroxy Lotion  Eucerin Original Crme  Eucerin Original Lotion  Eucerin Plus Crme Eucerin Plus Lotion  Eucerin TriLipid Replenishing Lotion  Keri Anti-Bacterial Hand Lotion  Keri Deep Conditioning Original Lotion Dry Skin Formula Softly Scented  Keri Deep Conditioning Original Lotion, Fragrance Free Sensitive Skin Formula  Keri Lotion Fast Absorbing Fragrance Free Sensitive Skin Formula  Keri Lotion Fast Absorbing Softly Scented Dry Skin Formula  Keri Original Lotion  Keri Skin Renewal Lotion Keri Silky Smooth Lotion  Keri Silky Smooth Sensitive Skin Lotion  Nivea Body Creamy Conditioning Oil  Nivea Body Extra Enriched Teacher, adult education Moisturizing Lotion Nivea Crme  Nivea Skin Firming Lotion  NutraDerm 30 Skin Lotion  NutraDerm Skin Lotion  NutraDerm Therapeutic Skin Cream  NutraDerm Therapeutic Skin Lotion  ProShield Protective Hand Cream  Provon moisturizing lotion  Please read over the following fact sheets that you were given.

## 2023-11-27 ENCOUNTER — Other Ambulatory Visit: Payer: Self-pay

## 2023-11-27 ENCOUNTER — Encounter (HOSPITAL_COMMUNITY)
Admission: RE | Admit: 2023-11-27 | Discharge: 2023-11-27 | Disposition: A | Discharge status: Home / Self Care | Source: Ambulatory Visit | Attending: Neurological Surgery | Admitting: Neurological Surgery

## 2023-11-27 ENCOUNTER — Encounter (HOSPITAL_COMMUNITY): Payer: Self-pay

## 2023-11-27 VITALS — BP 128/74 | HR 56 | Temp 97.9°F | Resp 18 | Ht 70.0 in | Wt 199.0 lb

## 2023-11-27 DIAGNOSIS — I129 Hypertensive chronic kidney disease with stage 1 through stage 4 chronic kidney disease, or unspecified chronic kidney disease: Secondary | ICD-10-CM | POA: Diagnosis not present

## 2023-11-27 DIAGNOSIS — Z01818 Encounter for other preprocedural examination: Secondary | ICD-10-CM | POA: Insufficient documentation

## 2023-11-27 DIAGNOSIS — Z01812 Encounter for preprocedural laboratory examination: Secondary | ICD-10-CM | POA: Diagnosis present

## 2023-11-27 DIAGNOSIS — K219 Gastro-esophageal reflux disease without esophagitis: Secondary | ICD-10-CM | POA: Insufficient documentation

## 2023-11-27 DIAGNOSIS — G4733 Obstructive sleep apnea (adult) (pediatric): Secondary | ICD-10-CM | POA: Diagnosis not present

## 2023-11-27 DIAGNOSIS — N183 Chronic kidney disease, stage 3 unspecified: Secondary | ICD-10-CM | POA: Insufficient documentation

## 2023-11-27 DIAGNOSIS — J310 Chronic rhinitis: Secondary | ICD-10-CM | POA: Diagnosis not present

## 2023-11-27 DIAGNOSIS — E785 Hyperlipidemia, unspecified: Secondary | ICD-10-CM | POA: Insufficient documentation

## 2023-11-27 DIAGNOSIS — Z87891 Personal history of nicotine dependence: Secondary | ICD-10-CM | POA: Insufficient documentation

## 2023-11-27 DIAGNOSIS — M5416 Radiculopathy, lumbar region: Secondary | ICD-10-CM | POA: Insufficient documentation

## 2023-11-27 DIAGNOSIS — Z01810 Encounter for preprocedural cardiovascular examination: Secondary | ICD-10-CM | POA: Diagnosis present

## 2023-11-27 LAB — CBC
HCT: 39.4 % (ref 39.0–52.0)
Hemoglobin: 14 g/dL (ref 13.0–17.0)
MCH: 34.6 pg — ABNORMAL HIGH (ref 26.0–34.0)
MCHC: 35.5 g/dL (ref 30.0–36.0)
MCV: 97.3 fL (ref 80.0–100.0)
Platelets: 210 K/uL (ref 150–400)
RBC: 4.05 MIL/uL — ABNORMAL LOW (ref 4.22–5.81)
RDW: 12.5 % (ref 11.5–15.5)
WBC: 5.5 K/uL (ref 4.0–10.5)
nRBC: 0 % (ref 0.0–0.2)

## 2023-11-27 LAB — TYPE AND SCREEN
ABO/RH(D): A NEG
Antibody Screen: NEGATIVE

## 2023-11-27 LAB — SURGICAL PCR SCREEN
MRSA, PCR: NEGATIVE
Staphylococcus aureus: POSITIVE — AB

## 2023-11-27 NOTE — Progress Notes (Signed)
 PCP - Dr. Dorn Sauers Cardiologist - Dr. Norleen Fairy Lewis - Last seen in 2012  PPM/ICD - Denies Device Orders - n/a Rep Notified - n/a  Chest x-ray - n/a EKG - 11-27-23 Stress Test - 04-27-10 [facility] ECHO - 04-27-10 - [facility] Cardiac Cath - Denies  Sleep Study - Yes, CPAP - wears CPAP every night unsure of settings  Fasting Blood Sugar - Denies Checks Blood Sugar _____ times a day: NA  Last dose of GLP1 agonist-  Denies GLP1 instructions: n/a  Blood Thinner Instructions: Denies Aspirin Instructions: per patient stopped on 11-27-23  ERAS Protcol - Clears until 8:30 PRE-SURGERY Ensure or G2- none  COVID TEST- n/a   Anesthesia review: Yes, HTN w/new EKG, OSA, CKD3  Patient denies shortness of breath, fever, cough and chest pain at PAT appointment. Patient states no complaints of respiratory issues at this time.    All instructions explained to the patient, with a verbal understanding of the material. Patient agrees to go over the instructions while at home for a better understanding. Patient also instructed to self quarantine after being tested for COVID-19. The opportunity to ask questions was provided.

## 2023-11-27 NOTE — Progress Notes (Addendum)
 Surgical Instructions     Your procedure is scheduled on November 29, 2023. Report to [facility] Main Entrance A at 9:00 A.M., then check in with the Admitting office. Any questions or running late day of surgery: call [phone]   Questions prior to your surgery date: call [phone], Monday-Friday, 8am-4pm. If you experience any cold or flu symptoms such as cough, fever, chills, shortness of breath, etc. between now and your scheduled surgery, please notify us  at the above number.            Remember:       Do not eat after midnight the night before your surgery   You may drink clear liquids until 8:00 AM the morning of your surgery.   Clear liquids allowed are: Water, Non-Citrus Juices (without pulp), Carbonated Beverages, Clear Tea (no milk, honey, etc.), Black Coffee Only (NO MILK, CREAM OR POWDERED CREAMER of any kind), and Gatorade.          Take these medicines the morning of surgery with A SIP OF WATER: amLODipine  (NORVASC )  atorvastatin (LIPITOR)  omeprazole (PRILOSEC)      Follow your surgeon's instructions on when to stop Aspirin.  If no instructions were given by your surgeon then you will need to call the office to get those instructions.       One week prior to surgery, STOP taking any Aleve, Naproxen, Ibuprofen, Motrin, Advil, Goody's, BC's, all herbal medications, fish oil, and non-prescription vitamins. This includes your medication: meloxicam (MOBIC).                      Do NOT Smoke (Tobacco/Vaping) for 24 hours prior to your procedure.   If you use a CPAP at night, you may bring your mask/headgear for your overnight stay.   You will be asked to remove any contacts, glasses, piercing's, hearing aid's, dentures/partials prior to surgery. Please bring cases for these items if needed.    Patients discharged the day of surgery will not be allowed to drive home, and someone needs to stay with them for 24 hours.   SURGICAL WAITING ROOM VISITATION Patients may  have no more than 2 support people in the waiting area - these visitors may rotate.   Pre-op nurse will coordinate an appropriate time for 1 ADULT support person, who may not rotate, to accompany patient in pre-op.  Children under the age of 79 must have an adult with them who is not the patient and must remain in the main waiting area with an adult.   If the patient needs to stay at the hospital during part of their recovery, the visitor guidelines for inpatient rooms apply.   Please refer to the [facility] website for the visitor guidelines for any additional information.     If you received a COVID test during your pre-op visit  it is requested that you wear a mask when out in public, stay away from anyone that may not be feeling well and notify your surgeon if you develop symptoms. If you have been in contact with anyone that has tested positive in the last 10 days please notify you surgeon.         Pre-operative 5 CHG Bathing Instructions    You can play a key role in reducing the risk of infection after surgery. Your skin needs to be as free of germs as possible. You can reduce the number of germs on your skin by washing with CHG (chlorhexidine  gluconate)  soap before surgery. CHG is an antiseptic soap that kills germs and continues to kill germs even after washing.    DO NOT use if you have an allergy to chlorhexidine /CHG or antibacterial soaps. If your skin becomes reddened or irritated, stop using the CHG and notify one of our RNs at [phone].    Please shower with the CHG soap starting 4 days before surgery using the following schedule:       Please keep in mind the following:  DO NOT shave, including legs and underarms, starting the day of your first shower.   You may shave your face at any point before/day of surgery.  Place clean sheets on your bed the day you start using CHG soap. Use a clean washcloth (not used since being washed) for each shower. DO NOT sleep with  pets once you start using the CHG.    CHG Shower Instructions:  Wash your face and private area with normal soap. If you choose to wash your hair, wash first with your normal shampoo.  After you use shampoo/soap, rinse your hair and body thoroughly to remove shampoo/soap residue.  Turn the water OFF and apply about 3 tablespoons (45 ml) of CHG soap to a CLEAN washcloth.  Apply CHG soap ONLY FROM YOUR NECK DOWN TO YOUR TOES (washing for 3-5 minutes)  DO NOT use CHG soap on face, private areas, open wounds, or sores.  Pay special attention to the area where your surgery is being performed.  If you are having back surgery, having someone wash your back for you may be helpful. Wait 2 minutes after CHG soap is applied, then you may rinse off the CHG soap.  Pat dry with a clean towel  Put on clean clothes/pajamas   If you choose to wear lotion, please use ONLY the CHG-compatible lotions that are listed below.   Additional instructions for the day of surgery: DO NOT APPLY any lotions, deodorants, cologne, or perfumes.   Do not bring valuables to the hospital. [facility] is not responsible for any belongings/valuables. Do not wear nail polish, gel polish, artificial nails, or any other type of covering on natural nails (fingers and toes) Do not wear jewelry or makeup Put on clean/comfortable clothes.  Please brush your teeth.  Ask your nurse before applying any prescription medications to the skin.        CHG Compatible Lotions    Aveeno Moisturizing lotion  Cetaphil Moisturizing Cream  Cetaphil Moisturizing Lotion  Clairol Herbal Essence Moisturizing Lotion, Dry Skin  Clairol Herbal Essence Moisturizing Lotion, Extra Dry Skin  Clairol Herbal Essence Moisturizing Lotion, Normal Skin  Curel Age Defying Therapeutic Moisturizing Lotion with Alpha Hydroxy  Curel Extreme Care Body Lotion  Curel Soothing Hands Moisturizing Hand Lotion  Curel Therapeutic Moisturizing Cream, Fragrance-Free   Curel Therapeutic Moisturizing Lotion, Fragrance-Free  Curel Therapeutic Moisturizing Lotion, Original Formula  Eucerin Daily Replenishing Lotion  Eucerin Dry Skin Therapy Plus Alpha Hydroxy Crme  Eucerin Dry Skin Therapy Plus Alpha Hydroxy Lotion  Eucerin Original Crme  Eucerin Original Lotion  Eucerin Plus Crme Eucerin Plus Lotion  Eucerin TriLipid Replenishing Lotion  Keri Anti-Bacterial Hand Lotion  Keri Deep Conditioning Original Lotion Dry Skin Formula Softly Scented  Keri Deep Conditioning Original Lotion, Fragrance Free Sensitive Skin Formula  Keri Lotion Fast Absorbing Fragrance Free Sensitive Skin Formula  Keri Lotion Fast Absorbing Softly Scented Dry Skin Formula  Keri Original Lotion  Keri Skin Renewal Lotion Keri Silky Smooth Lotion  [place]  Smooth Sensitive Skin Lotion  Nivea Body Creamy Conditioning Oil  Nivea Body Extra Enriched Lotion  Nivea Body Original Lotion  Nivea Body Sheer Moisturizing Lotion Nivea Crme  Nivea Skin Firming Lotion  NutraDerm 30 Skin Lotion  NutraDerm Skin Lotion  NutraDerm Therapeutic Skin Cream  NutraDerm Therapeutic Skin Lotion  ProShield Protective Hand Cream  Provon moisturizing lotion   Please read over the following fact sheets that you were given.

## 2023-11-28 NOTE — Progress Notes (Signed)
 Anesthesia Chart Review:  Case: [id number] Date/Time: 11/29/23 1045   Procedure: POSTERIOR LUMBAR FUSION 2 LEVEL (Back) - PLIF - L3-L4 - L5-S1 with extension of instrumentation - Posterior Lateral and Interbody fusion   Anesthesia type: General   Diagnosis: Radiculopathy, lumbar region [M54.16]   Pre-op diagnosis: Radiculopathy lumbar region   Location: [facility] OR ROOM 21 / [facility] OR   Surgeons: Joshua Alm Hamilton, MD       DISCUSSION: Patient is a 73 year old male scheduled for the above procedure.   History includes former smoker, HTN, HLD, GERD, OSA (uses CPAP), chronic rhinitis.  Last visit with pulmonologist for OSA, chronic rhinitis and cough. Cough improved with PPI BID. He had resumed gym activities due, although not to the same extent due to significant back issues. Follow-up CT in May showed resolution of RUL ground glass opacity, likely had been from prior infection. Spirometry showed normal airflow, without obstruction. Methacholine challenge was negative. Patient apparently expressed some concern of resistant HTN (BP 132/83), so cardiology consult offered for HTN, otherwise as needed pulmonology follow-up.   He had BP follow-up with Dr. Charlott on 8/13/025 for follow-up HTN, CKD stage 3, back pain. Continue current pain management while awaiting back surgery. BP 118/71 then. Creatinine 1.17, eGFR 65. Six month follow-up planned. BP 128/74 at PAT.   Anesthesia team to evaluate on the day of surgery.   VS: BP 128/74   Pulse (!) 56   Temp 36.6 C   Resp 18   Ht 5' 10 (1.778 m)   Wt 90.3 kg   SpO2 98%   BMI 28.55 kg/m    PROVIDERS: Charlott Dorn LABOR, MD is PCP  Winford Helena, MD is pulmonologist   LABS: Labs reviewed: Acceptable for surgery. (all labs ordered are listed, but only abnormal results are displayed)  Labs Reviewed  SURGICAL PCR SCREEN - Abnormal; Notable for the following components:      Result Value   Staphylococcus aureus POSITIVE (*)    All other  components within normal limits  CBC - Abnormal; Notable for the following components:   RBC 4.05 (*)    MCH 34.6 (*)    All other components within normal limits  TYPE AND SCREEN   BMP on 11/22/23 ([facility], see Care Everywhere: Glucose 94, BUN 26, Cr 1.17, eGFR 65, Na 139, K 4.2, Ca 9.6 (corrected 9.01), albumin 4.7.    OTHER:  PFTs 08/25/2023 ([facility]): FVe 3.36 (99.4%), post 3.44 (101.7%). FEV1 2.48 (96%), post 2.68 (103.9%). FEV1/FVC 78%, > LLN of 64%. Spirometry demonstrates normal airflow, without obstruction. Lung volume measurement was not performed, but normal FVC and FEV1-FVC are highly reliable at ruling out restriction. Negative methacholine challenge test.    IMAGES: CT Chest High Resolution 08/21/2023 ([facility]): IMPRESSION: No acute intrathoracic findings. No evidence of pneumonia.   CT L-spine 07/17/2023: IMPRESSION: 1. Previous discectomy and fusion procedure at L4-5. No evidence of hardware complication. No bony narrowing of the canal or foramina. 2. L3-4: Previous left hemilaminectomy. Endplate osteophytes and bulging of the disc. Facet joint hypertrophy. Moderate stenosis of the lateral recesses and foramina as shown by MRI. 3. L5-S1: Chronic disc degeneration with loss of disc height. Mild facet osteoarthritis. Osteophytic encroachment upon both neural foramina, worse on the left than the right. Either L5 nerve could be affected, more likely the left. 4. L2-3: Chronic disc degeneration with loss of disc height. Endplate osteophytes. Stenosis of the lateral recesses and foramina but without definite neural compression.  EKG: 11/27/2023: Sinus bradycardia at 55 bpm Otherwise normal ECG When compared with ECG of 27-Apr-2010 06:10, t wave abnormalities have improved Confirmed by Lonni Slain [phone]) on 11/27/2023 8:51:14 PM   CV: Stress echo 04/27/2010: Impressions:   - Low risk stress echo.   Past Medical History:  Diagnosis Date   Allergy     GERD (gastroesophageal reflux disease)    Hyperlipidemia    Hypertension    Sleep apnea    cpap    Past Surgical History:  Procedure Laterality Date   NASAL SINUS SURGERY  2012    MEDICATIONS:  amLODipine  (NORVASC ) 5 MG tablet   atorvastatin (LIPITOR) 20 MG tablet   gabapentin  (NEURONTIN ) 300 MG capsule   hydrochlorothiazide  (HYDRODIURIL ) 12.5 MG tablet   irbesartan  (AVAPRO ) 300 MG tablet   meloxicam (MOBIC) 7.5 MG tablet   omeprazole (PRILOSEC) 20 MG capsule   tadalafil (CIALIS) 20 MG tablet   zolpidem  (AMBIEN  CR) 12.5 MG CR tablet   No current facility-administered medications for this encounter.    Isaiah Ruder, PA-C Surgical Short Stay/Anesthesiology [facility] Phone [phone] [facility] Phone [phone] 11/28/2023 1:13 PM

## 2023-11-28 NOTE — Anesthesia Preprocedure Evaluation (Signed)
 Anesthesia Evaluation  Patient identified by MRN, date of birth, ID band Patient awake    Reviewed: Allergy & Precautions, NPO status , Patient's Chart, lab work & pertinent test results, reviewed documented beta blocker date and time   History of Anesthesia Complications Negative for: history of anesthetic complications  Airway Mallampati: III  TM Distance: >3 FB   Mouth opening: Limited Mouth Opening  Dental no notable dental hx.    Pulmonary sleep apnea and Continuous Positive Airway Pressure Ventilation , neg COPD, former smoker   breath sounds clear to auscultation       Cardiovascular hypertension, (-) CAD, (-) Past MI, (-) Cardiac Stents and (-) CABG  Rhythm:Regular Rate:Normal     Neuro/Psych neg Seizures    GI/Hepatic ,GERD  ,,(+) neg Cirrhosis        Endo/Other    Renal/GU Renal disease     Musculoskeletal   Abdominal   Peds  Hematology   Anesthesia Other Findings   Reproductive/Obstetrics                              Anesthesia Physical Anesthesia Plan  ASA: 2  Anesthesia Plan: General   Post-op Pain Management:    Induction: Intravenous  PONV Risk Score and Plan: 2 and Ondansetron  and Dexamethasone   Airway Management Planned: Oral ETT  Additional Equipment:   Intra-op Plan:   Post-operative Plan:   Informed Consent:   Plan Discussed with:   Anesthesia Plan Comments: (PAT note written 11/28/2023 by Amiracle Neises, PA-C.  )         Anesthesia Quick Evaluation

## 2023-11-29 ENCOUNTER — Other Ambulatory Visit: Payer: Self-pay

## 2023-11-29 ENCOUNTER — Ambulatory Visit (HOSPITAL_BASED_OUTPATIENT_CLINIC_OR_DEPARTMENT_OTHER)

## 2023-11-29 ENCOUNTER — Encounter (HOSPITAL_COMMUNITY)
Admission: RE | Disposition: A | Discharge status: Home / Self Care | Payer: Self-pay | Source: Home / Self Care | Attending: Neurological Surgery

## 2023-11-29 ENCOUNTER — Observation Stay (HOSPITAL_COMMUNITY)
Admission: RE | Admit: 2023-11-29 | Discharge: 2023-11-30 | Disposition: A | Discharge status: Home / Self Care | Attending: Neurological Surgery | Admitting: Neurological Surgery

## 2023-11-29 ENCOUNTER — Encounter (HOSPITAL_COMMUNITY): Payer: Self-pay | Admitting: Neurological Surgery

## 2023-11-29 ENCOUNTER — Ambulatory Visit (HOSPITAL_COMMUNITY): Payer: Self-pay | Admitting: Vascular Surgery

## 2023-11-29 ENCOUNTER — Ambulatory Visit (HOSPITAL_COMMUNITY)

## 2023-11-29 DIAGNOSIS — M5416 Radiculopathy, lumbar region: Secondary | ICD-10-CM

## 2023-11-29 DIAGNOSIS — I10 Essential (primary) hypertension: Secondary | ICD-10-CM | POA: Insufficient documentation

## 2023-11-29 DIAGNOSIS — Z87891 Personal history of nicotine dependence: Secondary | ICD-10-CM | POA: Diagnosis not present

## 2023-11-29 DIAGNOSIS — M48061 Spinal stenosis, lumbar region without neurogenic claudication: Secondary | ICD-10-CM

## 2023-11-29 DIAGNOSIS — Z981 Arthrodesis status: Principal | ICD-10-CM

## 2023-11-29 DIAGNOSIS — F1090 Alcohol use, unspecified, uncomplicated: Secondary | ICD-10-CM | POA: Insufficient documentation

## 2023-11-29 LAB — BASIC METABOLIC PANEL WITH GFR
Anion gap: 5 (ref 5–15)
BUN: 14 mg/dL (ref 8–23)
CO2: 24 mmol/L (ref 22–32)
Calcium: 8.9 mg/dL (ref 8.9–10.3)
Chloride: 110 mmol/L (ref 98–111)
Creatinine, Ser: 1.16 mg/dL (ref 0.61–1.24)
GFR, Estimated: >60 mL/min (ref >60)
Glucose, Bld: 101 mg/dL — ABNORMAL HIGH (ref 70–99)
Potassium: 3.8 mmol/L (ref 3.5–5.1)
Sodium: 139 mmol/L (ref 135–145)

## 2023-11-29 LAB — ABO/RH: ABO/RH(D): A NEG

## 2023-11-29 SURGERY — POSTERIOR LUMBAR FUSION 2 LEVEL
Anesthesia: General | Site: Back

## 2023-11-29 MED ORDER — ONDANSETRON HCL 4 MG/2ML IJ SOLN: 4.0000 mg | Freq: Four times a day (QID) | INTRAMUSCULAR | Status: DC | PRN

## 2023-11-29 MED ORDER — SENNA 8.6 MG PO TABS
1.0000 | ORAL_TABLET | Freq: Two times a day (BID) | ORAL | Status: DC
  Administered 2023-11-29: 8.6 mg via ORAL
  Filled 2023-11-29: qty 1

## 2023-11-29 MED ORDER — HYDROMORPHONE HCL 1 MG/ML IJ SOLN
INTRAMUSCULAR | Status: AC
  Filled 2023-11-29: qty 1

## 2023-11-29 MED ORDER — HYDROCHLOROTHIAZIDE 12.5 MG PO TABS: 12.5000 mg | ORAL_TABLET | Freq: Every morning | ORAL | Status: DC

## 2023-11-29 MED ORDER — PHENYLEPHRINE HCL-NACL 20-0.9 MG/250ML-% IV SOLN
INTRAVENOUS | Status: DC | PRN
Start: 2023-11-29 — End: 2023-11-29
  Administered 2023-11-29: 25 ug/min via INTRAVENOUS

## 2023-11-29 MED ORDER — THROMBIN 5000 UNITS EX SOLR
OROMUCOSAL | Status: DC | PRN
  Administered 2023-11-29: 5 mL via TOPICAL

## 2023-11-29 MED ORDER — ACETAMINOPHEN 500 MG PO TABS
1000.0000 mg | ORAL_TABLET | Freq: Four times a day (QID) | ORAL | Status: DC
  Administered 2023-11-29 – 2023-11-30 (×3): 1000 mg via ORAL
  Filled 2023-11-29 (×3): qty 2

## 2023-11-29 MED ORDER — LIDOCAINE 2% (20 MG/ML) 5 ML SYRINGE
INTRAMUSCULAR | Status: AC
  Filled 2023-11-29: qty 5

## 2023-11-29 MED ORDER — FENTANYL CITRATE (PF) 250 MCG/5ML IJ SOLN
INTRAMUSCULAR | Status: DC | PRN
  Administered 2023-11-29 (×2): 50 ug via INTRAVENOUS
  Administered 2023-11-29: 100 ug via INTRAVENOUS
  Administered 2023-11-29: 50 ug via INTRAVENOUS

## 2023-11-29 MED ORDER — ORAL CARE MOUTH RINSE: 15.0000 mL | Freq: Once | OROMUCOSAL | Status: AC

## 2023-11-29 MED ORDER — METHOCARBAMOL 1000 MG/10ML IJ SOLN
500.0000 mg | Freq: Once | INTRAMUSCULAR | Status: AC
  Administered 2023-11-29: 500 mg via INTRAVENOUS

## 2023-11-29 MED ORDER — THROMBIN 20000 UNITS EX SOLR
CUTANEOUS | Status: DC | PRN
  Administered 2023-11-29: 20 mL via TOPICAL

## 2023-11-29 MED ORDER — THROMBIN 20000 UNITS EX SOLR
CUTANEOUS | Status: AC
  Filled 2023-11-29: qty 20000

## 2023-11-29 MED ORDER — SODIUM CHLORIDE 0.9% FLUSH: 3.0000 mL | INTRAVENOUS | Status: DC | PRN

## 2023-11-29 MED ORDER — EPHEDRINE SULFATE-NACL 50-0.9 MG/10ML-% IV SOSY
PREFILLED_SYRINGE | INTRAVENOUS | Status: DC | PRN
  Administered 2023-11-29: 5 mg via INTRAVENOUS

## 2023-11-29 MED ORDER — SODIUM CHLORIDE 0.9 % IV SOLN: 250.0000 mL | INTRAVENOUS | Status: DC

## 2023-11-29 MED ORDER — GABAPENTIN 300 MG PO CAPS: 300.0000 mg | ORAL_CAPSULE | Freq: Every day | ORAL | Status: DC

## 2023-11-29 MED ORDER — MIDAZOLAM HCL 2 MG/2ML IJ SOLN
INTRAMUSCULAR | Status: AC
  Filled 2023-11-29: qty 2

## 2023-11-29 MED ORDER — SUGAMMADEX SODIUM 200 MG/2ML IV SOLN
INTRAVENOUS | Status: DC | PRN
  Administered 2023-11-29: 200 mg via INTRAVENOUS

## 2023-11-29 MED ORDER — LACTATED RINGERS IV SOLN: INTRAVENOUS | Status: DC

## 2023-11-29 MED ORDER — PHENOL 1.4 % MT LIQD: 1.0000 | OROMUCOSAL | Status: DC | PRN

## 2023-11-29 MED ORDER — ONDANSETRON HCL 4 MG/2ML IJ SOLN
INTRAMUSCULAR | Status: DC | PRN
  Administered 2023-11-29: 4 mg via INTRAVENOUS

## 2023-11-29 MED ORDER — MIDAZOLAM HCL 2 MG/2ML IJ SOLN
INTRAMUSCULAR | Status: DC | PRN
  Administered 2023-11-29: 2 mg via INTRAVENOUS

## 2023-11-29 MED ORDER — 0.9 % SODIUM CHLORIDE (POUR BTL) OPTIME
TOPICAL | Status: DC | PRN
  Administered 2023-11-29: 1000 mL

## 2023-11-29 MED ORDER — CELECOXIB 200 MG PO CAPS
200.0000 mg | ORAL_CAPSULE | Freq: Two times a day (BID) | ORAL | Status: DC
  Administered 2023-11-29: 200 mg via ORAL
  Filled 2023-11-29: qty 1

## 2023-11-29 MED ORDER — CHLORHEXIDINE GLUCONATE CLOTH 2 % EX PADS: 6.0000 | MEDICATED_PAD | Freq: Once | CUTANEOUS | Status: DC

## 2023-11-29 MED ORDER — AMLODIPINE BESYLATE 5 MG PO TABS: 5.0000 mg | ORAL_TABLET | Freq: Every day | ORAL | Status: DC

## 2023-11-29 MED ORDER — PROPOFOL 10 MG/ML IV BOLUS
INTRAVENOUS | Status: DC | PRN
  Administered 2023-11-29: 120 mg via INTRAVENOUS

## 2023-11-29 MED ORDER — PROPOFOL 10 MG/ML IV BOLUS
INTRAVENOUS | Status: AC
  Filled 2023-11-29: qty 20

## 2023-11-29 MED ORDER — DEXAMETHASONE 4 MG PO TABS
4.0000 mg | ORAL_TABLET | Freq: Four times a day (QID) | ORAL | Status: DC
  Administered 2023-11-29 – 2023-11-30 (×3): 4 mg via ORAL
  Filled 2023-11-29 (×3): qty 1

## 2023-11-29 MED ORDER — ONDANSETRON HCL 4 MG/2ML IJ SOLN
INTRAMUSCULAR | Status: AC
  Filled 2023-11-29: qty 2

## 2023-11-29 MED ORDER — ACETAMINOPHEN 500 MG PO TABS
1000.0000 mg | ORAL_TABLET | ORAL | Status: AC
  Administered 2023-11-29: 1000 mg via ORAL
  Filled 2023-11-29: qty 2

## 2023-11-29 MED ORDER — CEFAZOLIN SODIUM-DEXTROSE 2-4 GM/100ML-% IV SOLN
INTRAVENOUS | Status: AC
Start: 2023-11-29 — End: 2023-11-29
  Filled 2023-11-29: qty 100

## 2023-11-29 MED ORDER — ROCURONIUM BROMIDE 10 MG/ML (PF) SYRINGE
PREFILLED_SYRINGE | INTRAVENOUS | Status: DC | PRN
  Administered 2023-11-29 (×2): 30 mg via INTRAVENOUS
  Administered 2023-11-29: 60 mg via INTRAVENOUS

## 2023-11-29 MED ORDER — BUPIVACAINE HCL (PF) 0.25 % IJ SOLN
INTRAMUSCULAR | Status: AC
Start: 2023-11-29 — End: 2023-11-29
  Filled 2023-11-29: qty 30

## 2023-11-29 MED ORDER — CEFAZOLIN SODIUM-DEXTROSE 2-4 GM/100ML-% IV SOLN
2.0000 g | INTRAVENOUS | Status: AC
  Administered 2023-11-29: 2 g via INTRAVENOUS
  Filled 2023-11-29: qty 100

## 2023-11-29 MED ORDER — METHOCARBAMOL 1000 MG/10ML IJ SOLN
INTRAMUSCULAR | Status: AC
  Filled 2023-11-29: qty 10

## 2023-11-29 MED ORDER — BUPIVACAINE HCL (PF) 0.25 % IJ SOLN
INTRAMUSCULAR | Status: DC | PRN
  Administered 2023-11-29: 6 mL
  Administered 2023-11-29: 20 mL

## 2023-11-29 MED ORDER — PHENYLEPHRINE 80 MCG/ML (10ML) SYRINGE FOR IV PUSH (FOR BLOOD PRESSURE SUPPORT)
PREFILLED_SYRINGE | INTRAVENOUS | Status: AC
  Filled 2023-11-29: qty 10

## 2023-11-29 MED ORDER — OXYCODONE HCL 5 MG PO TABS
10.0000 mg | ORAL_TABLET | ORAL | Status: DC | PRN
  Administered 2023-11-29 – 2023-11-30 (×4): 10 mg via ORAL
  Filled 2023-11-29 (×4): qty 2

## 2023-11-29 MED ORDER — DEXAMETHASONE SODIUM PHOSPHATE 10 MG/ML IJ SOLN
INTRAMUSCULAR | Status: DC | PRN
  Administered 2023-11-29: 5 mg via INTRAVENOUS

## 2023-11-29 MED ORDER — PHENYLEPHRINE 80 MCG/ML (10ML) SYRINGE FOR IV PUSH (FOR BLOOD PRESSURE SUPPORT)
PREFILLED_SYRINGE | INTRAVENOUS | Status: DC | PRN
Start: 2023-11-29 — End: 2023-11-29
  Administered 2023-11-29: 160 ug via INTRAVENOUS
  Administered 2023-11-29: 80 ug via INTRAVENOUS

## 2023-11-29 MED ORDER — MENTHOL 3 MG MT LOZG: 1.0000 | LOZENGE | OROMUCOSAL | Status: DC | PRN

## 2023-11-29 MED ORDER — CHLORHEXIDINE GLUCONATE 0.12 % MT SOLN
15.0000 mL | Freq: Once | OROMUCOSAL | Status: AC
  Administered 2023-11-29: 15 mL via OROMUCOSAL
  Filled 2023-11-29: qty 15

## 2023-11-29 MED ORDER — DEXAMETHASONE SODIUM PHOSPHATE 10 MG/ML IJ SOLN
INTRAMUSCULAR | Status: AC
Start: 2023-11-29 — End: 2023-11-29
  Filled 2023-11-29: qty 1

## 2023-11-29 MED ORDER — ONDANSETRON HCL 4 MG PO TABS: 4.0000 mg | ORAL_TABLET | Freq: Four times a day (QID) | ORAL | Status: DC | PRN

## 2023-11-29 MED ORDER — THROMBIN 5000 UNITS EX KIT
PACK | CUTANEOUS | Status: AC
Start: 2023-11-29 — End: 2023-11-29
  Filled 2023-11-29: qty 1

## 2023-11-29 MED ORDER — METHOCARBAMOL 500 MG PO TABS
500.0000 mg | ORAL_TABLET | Freq: Four times a day (QID) | ORAL | Status: DC | PRN
  Administered 2023-11-29: 500 mg via ORAL
  Filled 2023-11-29: qty 1

## 2023-11-29 MED ORDER — SURGIRINSE WOUND IRRIGATION SYSTEM - OPTIME
TOPICAL | Status: DC | PRN
  Administered 2023-11-29: 450 mL via TOPICAL

## 2023-11-29 MED ORDER — CEFAZOLIN SODIUM-DEXTROSE 2-4 GM/100ML-% IV SOLN
2.0000 g | Freq: Three times a day (TID) | INTRAVENOUS | Status: AC
  Administered 2023-11-29 – 2023-11-30 (×2): 2 g via INTRAVENOUS
  Filled 2023-11-29 (×2): qty 100

## 2023-11-29 MED ORDER — POTASSIUM CHLORIDE IN NACL 20-0.9 MEQ/L-% IV SOLN
INTRAVENOUS | Status: DC
  Filled 2023-11-29: qty 1000

## 2023-11-29 MED ORDER — DEXAMETHASONE SODIUM PHOSPHATE 4 MG/ML IJ SOLN: 4.0000 mg | Freq: Four times a day (QID) | INTRAMUSCULAR | Status: DC

## 2023-11-29 MED ORDER — HYDROMORPHONE HCL 1 MG/ML IJ SOLN
0.2500 mg | INTRAMUSCULAR | Status: DC | PRN
  Administered 2023-11-29 (×5): 0.25 mg via INTRAVENOUS

## 2023-11-29 MED ORDER — IRBESARTAN 150 MG PO TABS
300.0000 mg | ORAL_TABLET | Freq: Every day | ORAL | Status: DC
  Administered 2023-11-29: 300 mg via ORAL
  Filled 2023-11-29: qty 2

## 2023-11-29 MED ORDER — HYDROMORPHONE HCL 1 MG/ML IJ SOLN: 0.5000 mg | INTRAMUSCULAR | Status: DC | PRN

## 2023-11-29 MED ORDER — LIDOCAINE 2% (20 MG/ML) 5 ML SYRINGE
INTRAMUSCULAR | Status: DC | PRN
  Administered 2023-11-29: 100 mg via INTRAVENOUS

## 2023-11-29 MED ORDER — TAMSULOSIN HCL 0.4 MG PO CAPS
0.4000 mg | ORAL_CAPSULE | Freq: Every day | ORAL | Status: DC
  Administered 2023-11-29: 0.4 mg via ORAL
  Filled 2023-11-29: qty 1

## 2023-11-29 MED ORDER — GABAPENTIN 300 MG PO CAPS
300.0000 mg | ORAL_CAPSULE | ORAL | Status: AC
  Administered 2023-11-29: 300 mg via ORAL
  Filled 2023-11-29: qty 1

## 2023-11-29 MED ORDER — METHOCARBAMOL 1000 MG/10ML IJ SOLN: 500.0000 mg | Freq: Four times a day (QID) | INTRAMUSCULAR | Status: DC | PRN

## 2023-11-29 MED ORDER — SODIUM CHLORIDE 0.9% FLUSH
3.0000 mL | Freq: Two times a day (BID) | INTRAVENOUS | Status: DC
  Administered 2023-11-29: 3 mL via INTRAVENOUS

## 2023-11-29 MED ORDER — PANTOPRAZOLE SODIUM 40 MG PO TBEC: 40.0000 mg | DELAYED_RELEASE_TABLET | Freq: Every day | ORAL | Status: DC

## 2023-11-29 MED ORDER — FENTANYL CITRATE (PF) 250 MCG/5ML IJ SOLN
INTRAMUSCULAR | Status: AC
  Filled 2023-11-29: qty 5

## 2023-11-29 MED ORDER — ROCURONIUM BROMIDE 10 MG/ML (PF) SYRINGE
PREFILLED_SYRINGE | INTRAVENOUS | Status: AC
  Filled 2023-11-29: qty 10

## 2023-11-29 SURGICAL SUPPLY — 56 items
ALLOGRAFT BONE FIBER KORE 5 (Bone Implant) IMPLANT
BAG COUNTER SPONGE SURGICOUNT (BAG) ×1 IMPLANT
BASKET BONE COLLECTION (BASKET) ×1 IMPLANT
BENZOIN TINCTURE PRP APPL 2/3 (GAUZE/BANDAGES/DRESSINGS) ×1 IMPLANT
BLADE BONE MILL MEDIUM (MISCELLANEOUS) ×1 IMPLANT
BLADE CLIPPER SURG (BLADE) IMPLANT
BUR CARBIDE MATCH 3.0 (BURR) ×1 IMPLANT
CANISTER SUCTION 3000ML PPV (SUCTIONS) ×1 IMPLANT
CLEANSER WND VASHE INSTL 475 (WOUND CARE) ×1 IMPLANT
CNTNR URN SCR LID CUP LEK RST (MISCELLANEOUS) ×1 IMPLANT
COVER BACK TABLE 60X90IN (DRAPES) ×1 IMPLANT
DERMABOND ADVANCED .7 DNX12 (GAUZE/BANDAGES/DRESSINGS) ×1 IMPLANT
DRAPE C-ARM 42X72 X-RAY (DRAPES) ×2 IMPLANT
DRAPE C-ARMOR (DRAPES) ×2 IMPLANT
DRAPE LAPAROTOMY 100X72X124 (DRAPES) ×1 IMPLANT
DRAPE SURG 17X23 STRL (DRAPES) ×1 IMPLANT
DRSG AQUACEL AG ADV 3.5X10 (GAUZE/BANDAGES/DRESSINGS) IMPLANT
DURAPREP 26ML APPLICATOR (WOUND CARE) ×1 IMPLANT
ELECTRODE REM PT RTRN 9FT ADLT (ELECTROSURGICAL) ×1 IMPLANT
EVACUATOR 1/8 PVC DRAIN (DRAIN) ×1 IMPLANT
GAUZE 4X4 16PLY ~~LOC~~+RFID DBL (SPONGE) IMPLANT
GLOVE BIO SURGEON STRL SZ7 (GLOVE) IMPLANT
GLOVE BIO SURGEON STRL SZ8 (GLOVE) ×2 IMPLANT
GLOVE BIOGEL PI IND STRL 7.0 (GLOVE) IMPLANT
GOWN STRL REUS W/ TWL LRG LVL3 (GOWN DISPOSABLE) ×1 IMPLANT
GOWN STRL REUS W/ TWL XL LVL3 (GOWN DISPOSABLE) ×1 IMPLANT
GOWN STRL REUS W/TWL 2XL LVL3 (GOWN DISPOSABLE) IMPLANT
HEMOSTAT POWDER KIT SURGIFOAM (HEMOSTASIS) ×1 IMPLANT
KIT BASIN OR (CUSTOM PROCEDURE TRAY) ×1 IMPLANT
KIT INFUSE X SMALL 1.4CC (Orthopedic Implant) IMPLANT
KIT TURNOVER KIT B (KITS) ×1 IMPLANT
MILL BONE PREP (MISCELLANEOUS) ×1 IMPLANT
NDL HYPO 25X1 1.5 SAFETY (NEEDLE) ×1 IMPLANT
NEEDLE HYPO 25X1 1.5 SAFETY (NEEDLE) ×1 IMPLANT
NS IRRIG 1000ML POUR BTL (IV SOLUTION) ×1 IMPLANT
PACK LAMINECTOMY NEURO (CUSTOM PROCEDURE TRAY) ×1 IMPLANT
PAD ARMBOARD POSITIONER FOAM (MISCELLANEOUS) ×3 IMPLANT
ROD LORD LIPPED TI 5.5X90 (Rod) IMPLANT
SCREW CORT SHANK MOD 6.5X40 (Screw) IMPLANT
SCREW KODIAK 6.5X40 (Screw) IMPLANT
SCREW POLYAXIAL TULIP (Screw) IMPLANT
SET SCREW SPNE (Screw) IMPLANT
SOLUTION IRRIG SURGIPHOR (IV SOLUTION) IMPLANT
SPACER IDENTITI 9X7X25 5D (Spacer) IMPLANT
SPACER IDENTITI PS 5D 8X9X25 (Spacer) IMPLANT
SPONGE SURGIFOAM ABS GEL 100 (HEMOSTASIS) ×1 IMPLANT
SPONGE T-LAP 4X18 ~~LOC~~+RFID (SPONGE) IMPLANT
STRIP CLOSURE SKIN 1/2X4 (GAUZE/BANDAGES/DRESSINGS) ×2 IMPLANT
SUT VIC AB 0 CT1 18XCR BRD8 (SUTURE) ×1 IMPLANT
SUT VIC AB 2-0 CP2 18 (SUTURE) ×1 IMPLANT
SUT VIC AB 3-0 SH 8-18 (SUTURE) ×2 IMPLANT
SYR CONTROL 10ML LL (SYRINGE) ×1 IMPLANT
TOWEL GREEN STERILE (TOWEL DISPOSABLE) ×1 IMPLANT
TOWEL GREEN STERILE FF (TOWEL DISPOSABLE) ×1 IMPLANT
TRAY FOLEY MTR SLVR 16FR STAT (SET/KITS/TRAYS/PACK) ×1 IMPLANT
WATER STERILE IRR 1000ML POUR (IV SOLUTION) ×1 IMPLANT

## 2023-11-29 NOTE — Op Note (Signed)
 11/29/2023  3:14 PM  PATIENT:  Louis Barnes  73 y.o. male  PRE-OPERATIVE DIAGNOSIS: Adjacent level stenosis at L3-4, adjacent level stenosis L5-S1, left radiculopathy, previous lumbar fusion L4-5  POST-OPERATIVE DIAGNOSIS:  same  PROCEDURE:   1. Decompressive lumbar laminectomy, hemi facetectomy and foraminotomies L3-4 and L5-S1 requiring more work than would be required for a simple exposure of the disk for PLIF in order to adequately decompress the neural elements and address the spinal stenosis 2. Posterior lumbar interbody fusion L3-4 and L5-S1 using PTI interbody cages packed with morcellized allograft and autograft  3. Posterior fixation L3-S1 bilaterally using ATEC cortical pedicle screws.  4. Intertransverse arthrodesis L3-4 and L5-S1 using morcellized autograft and allograft.  SURGEON:  Alm Molt, MD  ASSISTANTSBETHA Pean FNP  ANESTHESIA:  General  EBL: 200 ml  Total I/O In: 1600 [I.V.:1600] Out: 350 [Urine:150; Blood:200]  BLOOD ADMINISTERED:none  DRAINS: none   INDICATION FOR PROCEDURE: This patient presented with back pain and left leg pain. Imaging revealed severe adjacent level disease with severe stenosis at L3-4 above her previous successful L4-5 fusion and severe left foraminal stenosis L5-S1 with left L5 nerve root compression. The patient tried a reasonable attempt at conservative medical measures without relief. I recommended decompression and instrumented fusion to address the stenosis as well as the segmental  instability.  Patient understood the risks, benefits, and alternatives and potential outcomes and wished to proceed.  PROCEDURE DETAILS:  The patient was brought to the operating room. After induction of generalized endotracheal anesthesia the patient was rolled into the prone position on chest rolls and all pressure points were padded. The patient's lumbar region was cleaned and then prepped with DuraPrep and draped in the usual sterile fashion.  Anesthesia was injected and then a dorsal midline incision was made and carried down to the lumbosacral fascia. The fascia was opened and the paraspinous musculature was taken down in a subperiosteal fashion to expose L3-4, L5-S1 and the previously placed instrumentation at L4-5. A self-retaining retractor was placed. Intraoperative fluoroscopy confirmed my level, and I started with placement of the L3 cortical pedicle screws. The pedicle screw entry zones were identified utilizing surface landmarks and  AP and lateral fluoroscopy. I scored the cortex with the high-speed drill and then used the hand drill to drill an upward and outward direction into the pedicle. I then tapped line to line. I then placed a 6.5 x 40 mm cortical pedicle screw into the pedicles of L3 bilaterally.   We removed the locking caps from the previous construct and remove the rods.  The screws had good purchase.   I then turned my attention to the decompression and complete lumbar laminectomies, hemi- facetectomies, and foraminotomies were performed at L3-4 and at L5-S1.  My nurse practitioner was directly involved in the decompression and exposure of the neural elements. the patient had significant spinal stenosis and this required more work than would be required for a simple exposure of the disc for posterior lumbar interbody fusion which would only require a limited laminotomy. Much more generous decompression and generous foraminotomy was undertaken in order to adequately decompress the neural elements and address the patient's leg pain. The yellow ligament was removed to expose the underlying dura and nerve roots, and generous foraminotomies were performed to adequately decompress the neural elements. Both the exiting and traversing nerve roots were decompressed on both sides until a coronary dilator passed easily along the nerve roots. Once the decompression was complete, I turned my attention  to the posterior lower lumbar interbody  fusion. The epidural venous vasculature was coagulated and cut sharply. Disc space was incised and the initial discectomy was performed with pituitary rongeurs. The disc space was distracted with sequential distractors to a height of 7 mm at L5-S1 and 8 mm at L3-4. We then used a series of scrapers and shavers to prepare the endplates for fusion. The midline was prepared with Epstein curettes. Once the complete discectomy was finished, we packed an appropriate sized interbody cage with local autograft and morcellized allograft, gently retracted the nerve root, and tapped the cage into position at L3-4 and L5-S1.  The midline between the cages was packed with morselized autograft and allograft.   We then turned our attention to the placement of the lower pedicle screws. The pedicle screw entry zones were identified utilizing surface landmarks and fluoroscopy. I drilled into each pedicle utilizing the hand drill, and tapped each pedicle with the appropriate tap. We palpated with a ball probe to assure no break in the cortex. We then placed 6.5 x 40 mm pedicle screws into the pedicles bilaterally at S1.  My nurse practitioner assisted in placement of the pedicle screws.  We then decorticated the transverse processes and laid a mixture of morcellized autograft and allograft out over these to perform intertransverse arthrodesis at 3-4 and L5-S1. We then placed lordotic rods into the multiaxial screw heads of the pedicle screws and locked these in position with the locking caps and anti-torque device. We then checked our construct with AP and lateral fluoroscopy. Irrigated with copious amounts of 0.5% povidone iodine solution followed by saline solution. Inspected the nerve roots once again to assure adequate decompression, lined to the dura with Gelfoam,  and then we closed the muscle and the fascia with 0 Vicryl. Closed the subcutaneous tissues with 2-0 Vicryl and subcuticular tissues with 3-0 Vicryl. The skin was  closed with benzoin and Steri-Strips. Dressing was then applied, the patient was awakened from general anesthesia and transported to the recovery room in stable condition. At the end of the procedure all sponge, needle and instrument counts were correct.   PLAN OF CARE: admit to inpatient  PATIENT DISPOSITION:  PACU - hemodynamically stable.   Delay start of Pharmacological VTE agent (>24hrs) due to surgical blood loss or risk of bleeding:  yes

## 2023-11-29 NOTE — Transfer of Care (Signed)
 Immediate Anesthesia Transfer of Care Note  Patient: Louis Barnes  Procedure(s) Performed: Posterior Lateral Interbody Fusion - Lumbar Three-Lumbar Four - Lumbar Five-Sacral One with extension of instrumentation - Posterior Lateral and Interbody fusion (Back)  Patient Location: PACU  Anesthesia Type:General  Level of Consciousness: drowsy  Airway & Oxygen Therapy: Patient Spontanous Breathing and Patient connected to face mask oxygen  Post-op Assessment: Report given to RN and Post -op Vital signs reviewed and stable  Post vital signs: Reviewed and stable  Last Vitals:  Vitals Value Taken Time  BP 111/63 11/29/23 15:19  Temp 36.9 C 11/29/23 15:20  Pulse 64 11/29/23 15:21  Resp 11 11/29/23 15:21  SpO2 98 % 11/29/23 15:21  Vitals shown include unfiled device data.  Last Pain:  Vitals:   11/29/23 1028  TempSrc:   PainSc: 8       Patients Stated Pain Goal: 0 (11/29/23 1028)  Complications: No notable events documented.

## 2023-11-29 NOTE — H&P (Signed)
 Subjective: Patient is a 73 y.o. male admitted for back and leg pain. Onset of symptoms was several months ago, gradually worsening since that time.  The pain is rated severe, and is located at the across the lower back and radiates to LLE. The pain is described as aching and occurs all day. The symptoms have been progressive. Symptoms are exacerbated by exercise, standing, and walking for more than a few minutes. MRI or CT showed adjacent level stenosis L3-4 L5-S1   Past Medical History:  Diagnosis Date   Allergy    GERD (gastroesophageal reflux disease)    Hyperlipidemia    Hypertension    Sleep apnea    cpap    Past Surgical History:  Procedure Laterality Date   NASAL SINUS SURGERY  2012    Prior to Admission medications   Medication Sig Start Date End Date Taking? Authorizing Provider  amLODipine  (NORVASC ) 5 MG tablet Take 5 mg by mouth daily.   Yes [provider]  atorvastatin (LIPITOR) 20 MG tablet Take 20 mg by mouth daily.   Yes [provider]  gabapentin  (NEURONTIN ) 300 MG capsule Take 300 mg by mouth at bedtime. 08/28/23  Yes [provider]  hydrochlorothiazide  (HYDRODIURIL ) 12.5 MG tablet Take 12.5 mg by mouth every morning. 09/22/23  Yes [provider]  irbesartan  (AVAPRO ) 300 MG tablet Take 300 mg by mouth daily.   Yes [provider]  omeprazole (PRILOSEC) 20 MG capsule Take 20 mg by mouth 2 (two) times daily.   Yes [provider]  zolpidem  (AMBIEN  CR) 12.5 MG CR tablet Take 12.5 mg by mouth at bedtime as needed.  12/21/11  Yes [provider]  meloxicam (MOBIC) 7.5 MG tablet SMARTSIG:1 Tablet(s) By Mouth Every 12 Hours PRN    [provider]  tadalafil (CIALIS) 20 MG tablet Take 20 mg by mouth daily as needed for erectile dysfunction (every three).    [provider]   No Known Allergies  Social History   Tobacco Use   Smoking status: Former    Current packs/day: 0.00    Types:  Cigarettes    Quit date: 12/21/1969    Years since quitting: 53.9   Smokeless tobacco: Never  Substance Use Topics   Alcohol use: Yes    Alcohol/week: 5.0 standard drinks of alcohol    Types: 5 Glasses of wine per week    Family History  Problem Relation Age of Onset   Colon cancer Paternal Grandmother        does not age of onset     Review of Systems  Positive ROS: neg  All other systems have been reviewed and were otherwise negative with the exception of those mentioned in the HPI and as above.  Objective: Vital signs in last 24 hours:    General Appearance: Alert, cooperative, no distress, appears stated age Head: Normocephalic, without obvious abnormality, atraumatic Eyes: PERRL, conjunctiva/corneas clear, EOM's intact    Neck: Supple, symmetrical, trachea midline Back: Symmetric, no curvature, ROM normal, no CVA tenderness Lungs:  respirations unlabored Heart: Regular rate and rhythm Abdomen: Soft, non-tender Extremities: Extremities normal, atraumatic, no cyanosis or edema Pulses: 2+ and symmetric all extremities Skin: Skin color, texture, turgor normal, no rashes or lesions  NEUROLOGIC:   Mental status: Alert and oriented x4,  no aphasia, good attention span, fund of knowledge, and memory Motor Exam - grossly normal Sensory Exam - grossly normal Reflexes: 1+ Coordination - grossly normal Gait - grossly normal Balance -  grossly normal Cranial Nerves: I: smell Not tested  II: visual acuity  OS: nl    OD: nl  II: visual fields Full to confrontation  II: pupils Equal, round, reactive to light  III,VII: ptosis None  III,IV,VI: extraocular muscles  Full ROM  V: mastication Normal  V: facial light touch sensation  Normal  V,VII: corneal reflex  Present  VII: facial muscle function - upper  Normal  VII: facial muscle function - lower Normal  VIII: hearing Not tested  IX: soft palate elevation  Normal  IX,X: gag reflex Present  XI: trapezius strength  5/5   XI: sternocleidomastoid strength 5/5  XI: neck flexion strength  5/5  XII: tongue strength  Normal    Data Review Lab Results  Component Value Date   WBC 5.5 11/27/2023   HGB 14.0 11/27/2023   HCT 39.4 11/27/2023   MCV 97.3 11/27/2023   PLT 210 11/27/2023   Lab Results  Component Value Date   NA 141 04/26/2010   K 3.3 (L) 04/26/2010   CL 105 04/26/2010   CO2 25 04/26/2010   BUN 15 04/26/2010   CREATININE 1.05 04/26/2010   GLUCOSE 160 (H) 04/26/2010   No results found for: INR, PROTIME  Assessment/Plan:  Estimated body mass index is 28.55 kg/m as calculated from the following:   Height as of 11/27/23: 5' 10 (1.778 m).   Weight as of 11/27/23: 90.3 kg. Patient admitted for PLIF L3-4 L5-S1. Patient has failed a reasonable attempt at conservative therapy.  I explained the condition and procedure to the patient and answered any questions.  Patient wishes to proceed with procedure as planned. Understands risks/ benefits and typical outcomes of procedure.   Alm GORMAN Molt 11/29/2023 9:35 AM

## 2023-11-29 NOTE — Anesthesia Postprocedure Evaluation (Signed)
 Anesthesia Post Note  Patient: Louis Barnes  Procedure(s) Performed: Posterior Lateral Interbody Fusion - Lumbar Three-Lumbar Four - Lumbar Five-Sacral One with extension of instrumentation - Posterior Lateral and Interbody fusion (Back)     Patient location during evaluation: PACU Anesthesia Type: General Level of consciousness: awake and alert Pain management: pain level controlled Vital Signs Assessment: post-procedure vital signs reviewed and stable Respiratory status: spontaneous breathing, nonlabored ventilation, respiratory function stable and patient connected to nasal cannula oxygen Cardiovascular status: blood pressure returned to baseline and stable Postop Assessment: no apparent nausea or vomiting Anesthetic complications: no   No notable events documented.  Last Vitals:  Vitals:   11/29/23 1700 11/29/23 1728  BP: 104/64 115/70  Pulse: 68 67  Resp: 11 16  Temp: 36.4 C   SpO2: 98% 96%    Last Pain:  Vitals:   11/29/23 1645  TempSrc:   PainSc: 4                  Lynwood MARLA Cornea

## 2023-11-29 NOTE — Anesthesia Procedure Notes (Signed)
 Procedure Name: Intubation Date/Time: 11/29/2023 11:46 AM  Performed by: Elby Raelene SAUNDERS, CRNAPre-anesthesia Checklist: Patient identified, Emergency Drugs available, Suction available and Patient being monitored Patient Re-evaluated:Patient Re-evaluated prior to induction Oxygen Delivery Method: Circle System Utilized Preoxygenation: Pre-oxygenation with 100% oxygen Induction Type: IV induction Ventilation: Mask ventilation without difficulty Laryngoscope Size: Miller and 2 Grade View: Grade II Tube type: Oral Tube size: 7.5 mm Number of attempts: 1 Airway Equipment and Method: Stylet and Bite block Placement Confirmation: ETT inserted through vocal cords under direct vision, positive ETCO2 and breath sounds checked- equal and bilateral Secured at: 21 cm Tube secured with: Tape Dental Injury: Teeth and Oropharynx as per pre-operative assessment

## 2023-11-30 DIAGNOSIS — M48061 Spinal stenosis, lumbar region without neurogenic claudication: Secondary | ICD-10-CM | POA: Diagnosis not present

## 2023-11-30 MED ORDER — METHOCARBAMOL 500 MG PO TABS: 500.0000 mg | ORAL_TABLET | Freq: Four times a day (QID) | ORAL | 0 refills | Status: AC | PRN

## 2023-11-30 MED ORDER — OXYCODONE-ACETAMINOPHEN 5-325 MG PO TABS: 1.0000 | ORAL_TABLET | ORAL | 0 refills | Status: DC | PRN

## 2023-11-30 MED ORDER — ZOLPIDEM TARTRATE 5 MG PO TABS
5.0000 mg | ORAL_TABLET | Freq: Every evening | ORAL | Status: DC | PRN
  Administered 2023-11-30: 5 mg via ORAL
  Filled 2023-11-30: qty 1

## 2023-11-30 NOTE — Progress Notes (Signed)
 OT Cancellation Note  Patient Details Name: Louis Barnes MRN: [id number] DOB: Aug 27, 1950   Cancelled Treatment:    Reason Eval/Treat Not Completed: OT screened, no needs identified, will sign off (Discussed with providing PT, pt grossly ind with ADLs and mobility. Also has assist from spouse if needed. OT signing off, pt without acute OT needs.)  11/30/2023  AB, OTR/L  Acute Rehabilitation Services  Office: [phone]  [name] 11/30/2023, 10:20 AM

## 2023-11-30 NOTE — Plan of Care (Signed)
 Pt doing well. Pt given D/C instructions with verbal understanding. Rx's were sent to the pharmacy by MD. Pt's incision is clean and dry with no sign of infection. Pt's IV was removed prior to D/C. Pt D/C'd home via wheelchair per MD order. Pt is stable @ D/C and has no other needs at this time. Rema Fendt, RN

## 2023-11-30 NOTE — Progress Notes (Signed)
 8/21 Patient refused to sign however, the terms of the MOON Letter were explained and understood.

## 2023-11-30 NOTE — Care Management Obs Status (Signed)
 MEDICARE OBSERVATION STATUS NOTIFICATION   Patient Details  Name: Louis Barnes MRN: [id number] Date of Birth: [birth year]   Medicare Observation Status Notification Given:  Yes    Jon Cruel 11/30/2023, 8:58 AM

## 2023-11-30 NOTE — Progress Notes (Signed)
 OT Cancellation Note  Patient Details Name: Louis Barnes MRN: [id number] DOB: 08/07/1950   Cancelled Treatment:    Reason Eval/Treat Not Completed: Patient at procedure or test/ unavailable (Pt discussed with SW, OT to follow-up with pt when able)  11/30/2023  AB, OTR/L  Acute Rehabilitation Services  Office: [phone]   [name] 11/30/2023, 8:47 AM

## 2023-11-30 NOTE — Evaluation (Signed)
 Physical Therapy Brief Evaluation and Discharge Note  Patient Details Name: Louis Barnes MRN: [id number] DOB: 07/04/1950 Today's Date: 11/30/2023   History of Present Illness  Pt is a 73 yo male presenting to [facility] on 11/29/23 for elective PLIF L3-S1. PMH of HTN, OSA.  Clinical Impression  Patient evaluated by Physical Therapy with no further acute PT needs identified. All education has been completed and the patient has no further questions. Pt was able to demonstrate transfers and ambulation with gross modified independence and no AD. Pt was educated on precautions, brace application/wearing schedule, appropriate activity progression, and car transfer. See below for any follow-up Physical Therapy or equipment needs. PT is signing off. Thank you for this referral.        PT Assessment Patient does not need any further PT services  Assistance Needed at Discharge  PRN    Equipment Recommendations None recommended by PT  Recommendations for Other Services       Precautions/Restrictions Precautions Precautions: Back Precaution Booklet Issued: Yes (comment) Recall of Precautions/Restrictions: Intact Precaution/Restrictions Comments: Reviewed handout in detail and pt was cued for precautions during functional mobility. Required Braces or Orthoses: Spinal Brace Spinal Brace: Lumbar corset;Applied in sitting position Restrictions Weight Bearing Restrictions Per Provider Order: No        Mobility  Bed Mobility Rolling: Modified independent (Device/Increase time) Supine/Sidelying to sit: Modified independent (Device/Increased time) Sit to supine/sidelying: Modified independent (Device/Increased time)    Transfers Overall transfer level: Modified independent Equipment used: None                    Ambulation/Gait Ambulation/Gait assistance: Modified independent (Device/Increase time) Gait Distance (Feet): 300 Feet Assistive device: Rolling walker (2 wheels) Gait  Pattern/deviations: Step-through pattern, Decreased stride length Gait Speed: Pace WFL    Home Activity Instructions    Stairs Stairs: Yes Stairs assistance: Modified independent (Device/Increase time) Stair Management: One rail Right, Alternating pattern, Forwards Number of Stairs: 10    Modified Rankin (Stroke Patients Only)        Balance Overall balance assessment: Modified Independent                        Pertinent Vitals/Pain PT - Brief Vital Signs All Vital Signs Stable: Yes Pain Assessment Pain Assessment: Faces Faces Pain Scale: Hurts a little bit Pain Location: back Pain Descriptors / Indicators: Operative site guarding, Sore Pain Intervention(s): Limited activity within patient's tolerance, Monitored during session, Repositioned     Home Living Family/patient expects to be discharged to:: Private residence Living Arrangements: Spouse/significant other Available Help at Discharge: Family Home Environment: Stairs to enter   Home Equipment: Cane - single point;Shower seat        Prior Function Level of Independence: Independent      UE/LE Assessment   UE ROM/Strength/Tone/Coordination: WFL    LE ROM/Strength/Tone/Coordination: Impaired LE ROM/Strength/Tone/Coordination Deficits: Mild weakness with slight hip ROM deficits consistent with pre-op diagnosis    Communication   Communication Communication: No apparent difficulties     Cognition Overall Cognitive Status: Appears within functional limits for tasks assessed/performed       General Comments      Exercises     Assessment/Plan    PT Problem List         PT Visit Diagnosis Unsteadiness on feet (R26.81);Pain    No Skilled PT All education completed;Patient is modified independent with all activity/mobility   Co-evaluation  AMPAC 6 Clicks Help needed turning from your back to your side while in a flat bed without using bedrails?: None Help  needed moving from lying on your back to sitting on the side of a flat bed without using bedrails?: None Help needed moving to and from a bed to a chair (including a wheelchair)?: None Help needed standing up from a chair using your arms (e.g., wheelchair or bedside chair)?: None Help needed to walk in hospital room?: None Help needed climbing 3-5 steps with a railing? : None 6 Click Score: 24      End of Session Equipment Utilized During Treatment: Gait belt;Back brace Activity Tolerance: Patient tolerated treatment well Patient left: in bed;with call bell/phone within reach;with family/visitor present Nurse Communication: Mobility status PT Visit Diagnosis: Unsteadiness on feet (R26.81);Pain Pain - part of body:  (back)     Time: 9072-9054 PT Time Calculation (min) (ACUTE ONLY): 18 min  Charges:   PT Evaluation $PT Eval Low Complexity: 1 Low      Leita Sable, PT, DPT Acute Rehabilitation Services Secure Chat Preferred Office: [phone]   Leita JONETTA Sable  11/30/2023, 10:24 AM

## 2023-11-30 NOTE — Discharge Summary (Signed)
 Physician Discharge Summary  Patient ID: Louis Barnes MRN: [id number] DOB/AGE: 75/13/1952 75 y.o.  Admit date: 11/29/2023 Discharge date: 11/30/2023  Admission Diagnoses: Adjacent level stenosis at L3-4, adjacent level stenosis L5-S1, left radiculopathy, previous lumbar fusion L4-5     Discharge Diagnoses: same   Discharged Condition: good  Hospital Course: The patient was admitted on 11/29/2023 and taken to the operating room where the patient underwent PLIF L3-4, L5-S1. The patient tolerated the procedure well and was taken to the recovery room and then to the floor in stable condition. The hospital course was routine. There were no complications. The wound remained clean dry and intact. Pt had appropriate back soreness. No complaints of leg pain or new N/T/W. The patient remained afebrile with stable vital signs, and tolerated a regular diet. The patient continued to increase activities, and pain was well controlled with oral pain medications.   Consults: None  Significant Diagnostic Studies:  Results for orders placed or performed during the hospital encounter of 11/29/23  Basic metabolic panel   Collection Time: 11/29/23 10:16 AM  Result Value Ref Range   Sodium 139 135 - 145 mmol/L   Potassium 3.8 3.5 - 5.1 mmol/L   Chloride 110 98 - 111 mmol/L   CO2 24 22 - 32 mmol/L   Glucose, Bld 101 (H) 70 - 99 mg/dL   BUN 14 8 - 23 mg/dL   Creatinine, Ser 8.83 0.61 - 1.24 mg/dL   Calcium 8.9 8.9 - 89.6 mg/dL   GFR, Estimated >39 >39 mL/min   Anion gap 5 5 - 15  ABO/Rh   Collection Time: 11/29/23 10:17 AM  Result Value Ref Range   ABO/RH(D)      A NEG Performed at [facility], [address] [place], [place] [address]     DG Lumbar Spine 2-3 Views Result Date: 11/29/2023 CLINICAL DATA:  Elective surgery. EXAM: LUMBAR SPINE - 2-3 VIEW COMPARISON:  CT 07/17/2023 FINDINGS: Three fluoroscopic spot views of the lumbar spine submitted from the operating room. The previous  L4-L5 fusion now extends to the L3 and S1 levels with new interbody spacers. Fluoroscopy time 1 minute 2 seconds. Dose 44.5 mGy. IMPRESSION: Intraoperative fluoroscopy during lumbar spine surgery. Electronically Signed   By: Andrea Gasman M.D.   On: 11/29/2023 15:44   DG C-Arm 1-60 Min-No Report Result Date: 11/29/2023 Fluoroscopy was utilized by the requesting physician.  No radiographic interpretation.   DG C-Arm 1-60 Min-No Report Result Date: 11/29/2023 Fluoroscopy was utilized by the requesting physician.  No radiographic interpretation.   DG C-Arm 1-60 Min-No Report Result Date: 11/29/2023 Fluoroscopy was utilized by the requesting physician.  No radiographic interpretation.    Antibiotics:  Anti-infectives (From admission, onward)    Start     Dose/Rate Route Frequency Ordered Stop   11/29/23 2000  ceFAZolin  (ANCEF ) IVPB 2g/100 mL premix        2 g 200 mL/hr over 30 Minutes Intravenous Every 8 hours 11/29/23 1735 11/30/23 0357   11/29/23 1015  ceFAZolin  (ANCEF ) IVPB 2g/100 mL premix        2 g 200 mL/hr over 30 Minutes Intravenous On call to O.R. 11/29/23 1000 11/29/23 1128   11/29/23 1012  ceFAZolin  (ANCEF ) 2-4 GM/100ML-% IVPB       Note to Pharmacy: Gleason, Ginger E: cabinet override      11/29/23 1012 11/29/23 1208       Discharge Exam: Blood pressure 117/67, pulse 63, temperature 97.8 F (36.6 C), temperature  source Oral, resp. rate 20, height 5' 10 (1.778 m), weight 88 kg, SpO2 96%. Neurologic: Grossly normal Ambulating and voiding well incision cdi   Discharge Medications:   Allergies as of 11/30/2023   No Known Allergies      Medication List     TAKE these medications    amLODipine  5 MG tablet Commonly known as: NORVASC  Take 5 mg by mouth daily.   atorvastatin 20 MG tablet Commonly known as: LIPITOR Take 20 mg by mouth daily.   gabapentin  300 MG capsule Commonly known as: NEURONTIN  Take 300 mg by mouth at bedtime.   hydrochlorothiazide  12.5  MG tablet Commonly known as: HYDRODIURIL  Take 12.5 mg by mouth every morning.   irbesartan  300 MG tablet Commonly known as: AVAPRO  Take 300 mg by mouth daily.   meloxicam 7.5 MG tablet Commonly known as: MOBIC SMARTSIG:1 Tablet(s) By Mouth Every 12 Hours PRN   methocarbamol  500 MG tablet Commonly known as: ROBAXIN  Take 1 tablet (500 mg total) by mouth every 6 (six) hours as needed for muscle spasms.   omeprazole 20 MG capsule Commonly known as: PRILOSEC Take 20 mg by mouth 2 (two) times daily.   oxyCODONE -acetaminophen  5-325 MG tablet Commonly known as: Percocet Take 1 tablet by mouth every 4 (four) hours as needed for severe pain (pain score 7-10).   tadalafil 20 MG tablet Commonly known as: CIALIS Take 20 mg by mouth daily as needed for erectile dysfunction (every three).   zolpidem  12.5 MG CR tablet Commonly known as: AMBIEN  CR Take 12.5 mg by mouth at bedtime as needed.               Durable Medical Equipment  (From admission, onward)           Start     Ordered   11/29/23 1736  DME Walker rolling  Once       Question:  Patient needs a walker to treat with the following condition  Answer:  S/P lumbar fusion   11/29/23 1735   11/29/23 1736  DME 3 n 1  Once        11/29/23 1735            Disposition: home   Final Dx: PLIF L3-4, L5-S1  Discharge Instructions      Remove dressing in 72 hours   Complete by: As directed    Call MD for:   Complete by: As directed    Call MD for:  difficulty breathing, headache or visual disturbances   Complete by: As directed    Call MD for:  hives   Complete by: As directed    Call MD for:  persistant nausea and vomiting   Complete by: As directed    Call MD for:  redness, tenderness, or signs of infection (pain, swelling, redness, odor or green/yellow discharge around incision site)   Complete by: As directed    Call MD for:  severe uncontrolled pain   Complete by: As directed    Call MD for:   temperature >100.4   Complete by: As directed    Diet - low sodium heart healthy   Complete by: As directed    Increase activity slowly   Complete by: As directed           Signed: Suzen Lacks Roneka Gilpin 11/30/2023, 8:04 AM

## 2023-12-12 ENCOUNTER — Other Ambulatory Visit (HOSPITAL_BASED_OUTPATIENT_CLINIC_OR_DEPARTMENT_OTHER): Payer: Self-pay | Admitting: Neurological Surgery

## 2023-12-12 DIAGNOSIS — M5416 Radiculopathy, lumbar region: Secondary | ICD-10-CM

## 2023-12-15 ENCOUNTER — Ambulatory Visit (HOSPITAL_BASED_OUTPATIENT_CLINIC_OR_DEPARTMENT_OTHER)
Admission: RE | Admit: 2023-12-15 | Discharge: 2023-12-15 | Disposition: A | Discharge status: Home / Self Care | Source: Ambulatory Visit | Attending: Neurological Surgery | Admitting: Neurological Surgery

## 2023-12-15 DIAGNOSIS — M5416 Radiculopathy, lumbar region: Secondary | ICD-10-CM | POA: Diagnosis present

## 2023-12-28 ENCOUNTER — Encounter: Payer: Self-pay | Admitting: Emergency Medicine

## 2023-12-28 ENCOUNTER — Ambulatory Visit
Admission: EM | Admit: 2023-12-28 | Discharge: 2023-12-28 | Disposition: A | Discharge status: Home / Self Care | Attending: Nurse Practitioner | Admitting: Nurse Practitioner

## 2023-12-28 DIAGNOSIS — J0140 Acute pansinusitis, unspecified: Secondary | ICD-10-CM | POA: Diagnosis not present

## 2023-12-28 MED ORDER — AMOXICILLIN-POT CLAVULANATE 875-125 MG PO TABS: 1.0000 | ORAL_TABLET | Freq: Two times a day (BID) | ORAL | 0 refills | Status: DC

## 2023-12-28 MED ORDER — METHYLPREDNISOLONE 4 MG PO TBPK: ORAL_TABLET | ORAL | 0 refills | Status: DC

## 2023-12-28 MED ORDER — CETIRIZINE-PSEUDOEPHEDRINE ER 5-120 MG PO TB12: 1.0000 | ORAL_TABLET | Freq: Every day | ORAL | 0 refills | Status: AC

## 2023-12-28 NOTE — ED Provider Notes (Signed)
 [facility]    CSN: [id number] Arrival date & time: 12/28/23  9160      History   Chief Complaint Chief Complaint  Patient presents with   Facial Pain    sinus    HPI Iven Earnhart is a 73 y.o. male.   Discussed the use of AI scribe software for clinical note transcription with the patient, who declined the use of this technology.    Gustavo Dispenza is a 73 y.o. male who presents for evaluation of possible sinus infection. Symptoms include decreased energy, nasal congestion, postnasal drainage, sinus pressure, headache and mild cough.  He denies any fevers, runny nose, sneezing, sore throat, shortness of breath, wheezing, diarrhea, nausea or vomiting. Symptoms started about 4 days ago and gradually worsening since that time. He is drinking plenty of fluids.  Past history is significant for no history of pneumonia or bronchitis. Patient is a non-smoker. He has tried Nasacort , mucinex and Advil for his symptoms. He denies any known sick contacts.   The following sections of the patient's history were reviewed and updated as appropriate: allergies, current medications, past family history, past medical history, past social history, past surgical history, and problem list.      Past Medical History:  Diagnosis Date   Allergy    GERD (gastroesophageal reflux disease)    Hyperlipidemia    Hypertension    Sleep apnea    cpap    Patient Active Problem List   Diagnosis Date Noted   S/P lumbar fusion 11/29/2023   Benign prostatic hyperplasia 06/11/2013   Hypertension 06/11/2013   Obstructive sleep apnea 06/11/2013   Erectile dysfunction 06/11/2013   Urticaria 06/11/2013    Past Surgical History:  Procedure Laterality Date   NASAL SINUS SURGERY  2012       Home Medications    Prior to Admission medications   Medication Sig Start Date End Date Taking? Authorizing Provider  amoxicillin -clavulanate (AUGMENTIN ) 875-125 MG tablet Take 1  tablet by mouth every 12 (twelve) hours. 12/28/23  Yes Jessia Kief, FNP  cetirizine -pseudoephedrine  (ZYRTEC -D) 5-120 MG tablet Take 1 tablet by mouth daily with breakfast for 10 days. 12/28/23 01/07/24 Yes Iola Lukes, FNP  methylPREDNISolone  (MEDROL  DOSEPAK) 4 MG TBPK tablet Take as directed 12/28/23  Yes Graclyn Lawther, Lukes, FNP  amLODipine  (NORVASC ) 5 MG tablet Take 5 mg by mouth daily.    [provider]  atorvastatin (LIPITOR) 20 MG tablet Take 20 mg by mouth daily.    [provider]  gabapentin  (NEURONTIN ) 300 MG capsule Take 300 mg by mouth at bedtime. 08/28/23   [provider]  hydrochlorothiazide  (HYDRODIURIL ) 12.5 MG tablet Take 12.5 mg by mouth every morning. 09/22/23   [provider]  irbesartan  (AVAPRO ) 300 MG tablet Take 300 mg by mouth daily.    [provider]  meloxicam (MOBIC) 7.5 MG tablet SMARTSIG:1 Tablet(s) By Mouth Every 12 Hours PRN    [provider]  methocarbamol  (ROBAXIN ) 500 MG tablet Take 1 tablet (500 mg total) by mouth every 6 (six) hours as needed for muscle spasms. 11/30/23   Meyran, Suzen Lacks, NP  omeprazole (PRILOSEC) 20 MG capsule Take 20 mg by mouth 2 (two) times daily.    [provider]  oxyCODONE -acetaminophen  (PERCOCET) 5-325 MG tablet Take 1 tablet by mouth every 4 (four) hours as needed for severe pain (pain score 7-10). 11/30/23 11/29/24  Meyran, Suzen Lacks, NP  tadalafil (CIALIS) 20 MG tablet Take 20 mg by mouth daily as  needed for erectile dysfunction (every three).    [provider]  zolpidem  (AMBIEN  CR) 12.5 MG CR tablet Take 12.5 mg by mouth at bedtime as needed.  12/21/11   [provider]    Family History Family History  Problem Relation Age of Onset   Colon cancer Paternal Grandmother        does not age of onset    Social History Social History   Tobacco Use   Smoking status: Former    Current packs/day: 0.00    Types: Cigarettes    Quit  date: 12/21/1969    Years since quitting: 54.0   Smokeless tobacco: Never  Vaping Use   Vaping status: Never Used  Substance Use Topics   Alcohol use: Yes    Alcohol/week: 5.0 standard drinks of alcohol    Types: 5 Glasses of wine per week   Drug use: No     Allergies   Patient has no known allergies.   Review of Systems Review of Systems  Constitutional:  Positive for fatigue (no energy). Negative for fever.  HENT:  Positive for congestion, postnasal drip and sinus pressure. Negative for rhinorrhea, sneezing and sore throat.   Respiratory:  Positive for cough (very mild). Negative for shortness of breath and wheezing.   Gastrointestinal:  Negative for diarrhea, nausea and vomiting.  Neurological:  Positive for headaches.  All other systems reviewed and are negative.    Physical Exam Triage Vital Signs ED Triage Vitals  Encounter Vitals Group     BP 12/28/23 0901 124/73     Girls Systolic BP Percentile --      Girls Diastolic BP Percentile --      Boys Systolic BP Percentile --      Boys Diastolic BP Percentile --      Pulse Rate 12/28/23 0901 64     Resp 12/28/23 0901 17     Temp 12/28/23 0901 98.1 F (36.7 C)     Temp src --      SpO2 12/28/23 0901 97 %     Weight --      Height --      Head Circumference --      Peak Flow --      Pain Score 12/28/23 0909 4     Pain Loc --      Pain Education --      Exclude from Growth Chart --    No data found.  Updated Vital Signs BP 124/73 (BP Location: Right Arm)   Pulse 64   Temp 98.1 F (36.7 C)   Resp 17   SpO2 97%   Visual Acuity Right Eye Distance:   Left Eye Distance:   Bilateral Distance:    Right Eye Near:   Left Eye Near:    Bilateral Near:     Physical Exam Vitals reviewed.  Constitutional:      General: He is awake. He is not in acute distress.    Appearance: Normal appearance. He is well-developed. He is not ill-appearing, toxic-appearing or diaphoretic.  HENT:     Head: Normocephalic.      Right Ear: Hearing, tympanic membrane, ear canal and external ear normal. No drainage, swelling or tenderness. No middle ear effusion. Tympanic membrane is not erythematous.     Left Ear: Hearing, tympanic membrane, ear canal and external ear normal. No drainage, swelling or tenderness.  No middle ear effusion. Tympanic membrane is not erythematous.     Nose: Congestion present. No  rhinorrhea.     Right Sinus: Maxillary sinus tenderness and frontal sinus tenderness present.     Left Sinus: Maxillary sinus tenderness and frontal sinus tenderness present.     Mouth/Throat:     Lips: Pink.     Mouth: Mucous membranes are moist.     Pharynx: Oropharynx is clear. Uvula midline. No pharyngeal swelling, oropharyngeal exudate, posterior oropharyngeal erythema or uvula swelling.     Tonsils: No tonsillar exudate or tonsillar abscesses.  Eyes:     General: Vision grossly intact.     Conjunctiva/sclera: Conjunctivae normal.  Cardiovascular:     Rate and Rhythm: Normal rate and regular rhythm.     Heart sounds: Normal heart sounds.  Pulmonary:     Effort: Pulmonary effort is normal.     Breath sounds: Normal breath sounds and air entry.  Musculoskeletal:        General: Normal range of motion.     Cervical back: Full passive range of motion without pain, normal range of motion and neck supple.  Lymphadenopathy:     Cervical: No cervical adenopathy.  Skin:    General: Skin is warm and dry.  Neurological:     General: No focal deficit present.     Mental Status: He is alert and oriented to person, place, and time.  Psychiatric:        Mood and Affect: Mood normal.        Behavior: Behavior normal. Behavior is cooperative.      UC Treatments / Results  Labs (all labs ordered are listed, but only abnormal results are displayed) Labs Reviewed - No data to display  EKG   Radiology No results found.  Procedures Procedures (including critical care time)  Medications Ordered in  UC Medications - No data to display  Initial Impression / Assessment and Plan / UC Course  I have reviewed the triage vital signs and the nursing notes.  Pertinent labs & imaging results that were available during my care of the patient were reviewed by me and considered in my medical decision making (see chart for details).     Patient presents with symptoms consistent with acute bacterial sinusitis, including sinus pressure, congestion, facial pain, and nasal drainage. Given duration and severity of symptoms, bacterial etiology is suspected. Treatment initiated with antibiotics, Zyrtec -D for congestion, and a short course of prednisone to reduce inflammation. Patient advised to continue nasal spray, mucinex and Advil. Supportive care measures including increased fluids, rest, use of saline nasal spray or rinses, and warm compresses over the sinuses were also recommended. Patient was advised to follow up with PCP if symptoms do not improve within 5-7 days and to seek emergency care for worsening headache, vision changes, swelling around the eyes, high fever, or difficulty breathing. Final Clinical Impressions(s) / UC Diagnoses   Final diagnoses:  Acute non-recurrent pansinusitis     Discharge Instructions      You have sinus infection. A sinus infection, also called sinusitis, is inflammation of your sinuses. Sinusitis can be due to bacteria, viruses or allergies. Take medications as prescribed. Make sure you finish all the prescribed medications even if you start to feel better. You may also take tylenol  and/or ibuprofen as needed for pain and/or fever. Continue the nasal spray and mucinex. Drink enough fluid to keep your urine pale yellow. Staying hydrated will also help to thin your mucus. Use a cool mist humidifier to keep the humidity level in your home above 50%. Inhale steam for 10-15  minutes, 3-4 times a day. You can do this in the bathroom while a hot shower is running and/or purchase  over-the-counter vapor shower tablets which is great to help with nasal congestion.Try to limit your exposure to cool or dry air. Sleep with your head raised to decrease post-nasal drainage. Make sure you get enough sleep each night. Once you have finished your medication and your symptoms have improved, remember to replace your toothbrush to help prevent re-infection. It's normal for a cough to linger for several weeks after any respiratory illness, even after other symptoms have resolved. This happens because the airways remain irritated and take time to fully heal. As long as the cough gradually improves and there are no new concerning symptoms, this is part of the normal recovery process. If your symptoms get worse or if you develop any new or concerning symptoms, go to the emergency room right away. If you're not feeling better in a week or so, follow up with your primary care provider.      ED Prescriptions     Medication Sig Dispense Auth. Provider   amoxicillin -clavulanate (AUGMENTIN ) 875-125 MG tablet Take 1 tablet by mouth every 12 (twelve) hours. 14 tablet Louana Fontenot, [place], FNP   methylPREDNISolone  (MEDROL  DOSEPAK) 4 MG TBPK tablet Take as directed 21 tablet Iola Lukes, FNP   cetirizine -pseudoephedrine  (ZYRTEC -D) 5-120 MG tablet Take 1 tablet by mouth daily with breakfast for 10 days. 10 tablet Iola Lukes, FNP      PDMP not reviewed this encounter.   Iola Lukes, [place] 12/28/23 1003

## 2023-12-28 NOTE — Discharge Instructions (Addendum)
 You have sinus infection. A sinus infection, also called sinusitis, is inflammation of your sinuses. Sinusitis can be due to bacteria, viruses or allergies. Take medications as prescribed. Make sure you finish all the prescribed medications even if you start to feel better. You may also take tylenol  and/or ibuprofen as needed for pain and/or fever. Continue the nasal spray and mucinex. Drink enough fluid to keep your urine pale yellow. Staying hydrated will also help to thin your mucus. Use a cool mist humidifier to keep the humidity level in your home above 50%. Inhale steam for 10-15 minutes, 3-4 times a day. You can do this in the bathroom while a hot shower is running and/or purchase over-the-counter vapor shower tablets which is great to help with nasal congestion.Try to limit your exposure to cool or dry air. Sleep with your head raised to decrease post-nasal drainage. Make sure you get enough sleep each night. Once you have finished your medication and your symptoms have improved, remember to replace your toothbrush to help prevent re-infection. It's normal for a cough to linger for several weeks after any respiratory illness, even after other symptoms have resolved. This happens because the airways remain irritated and take time to fully heal. As long as the cough gradually improves and there are no new concerning symptoms, this is part of the normal recovery process. If your symptoms get worse or if you develop any new or concerning symptoms, go to the emergency room right away. If you're not feeling better in a week or so, follow up with your primary care provider.

## 2023-12-28 NOTE — ED Triage Notes (Signed)
 Pt c/o sinus pressure and congestion for 4-5days.

## 2024-02-15 ENCOUNTER — Encounter: Payer: Self-pay | Admitting: Podiatry

## 2024-02-15 ENCOUNTER — Ambulatory Visit (INDEPENDENT_AMBULATORY_CARE_PROVIDER_SITE_OTHER): Admitting: Podiatry

## 2024-02-15 DIAGNOSIS — B070 Plantar wart: Secondary | ICD-10-CM

## 2024-02-17 NOTE — Progress Notes (Signed)
 Subjective:   Patient ID: Louis Barnes, male   DOB: 73 y.o.   MRN: [id number]   HPI Patient presents with a lot of pain from lesion that has occurred plantar aspect left foot recently with history of wart formation.  States it is tender to walk on and has tried to trim it   ROS      Objective:  Physical Exam  Neurovascular status intact with keratotic lesion plantar left measuring about 7 x 7 mm that upon debridement shows pinpoint bleeding pain to lateral pressure     Assessment:  Verruca plantaris plantar left with pain     Plan:  Reviewed condition with the patient and went ahead today Sharp sterile debridement of the lesion with slight bleeding consistent with verruca apply chemical agent to create immune response with sterile dressing instructed what to do if blistering were to occur and reappoint as symptoms indicate

## 2024-03-11 ENCOUNTER — Other Ambulatory Visit (HOSPITAL_COMMUNITY): Payer: Self-pay | Admitting: Orthopedic Surgery

## 2024-03-11 ENCOUNTER — Ambulatory Visit (HOSPITAL_COMMUNITY)
Admission: RE | Admit: 2024-03-11 | Discharge: 2024-03-11 | Disposition: A | Discharge status: Home / Self Care | Source: Ambulatory Visit | Attending: Surgery | Admitting: Surgery

## 2024-03-11 DIAGNOSIS — R52 Pain, unspecified: Secondary | ICD-10-CM | POA: Insufficient documentation

## 2024-03-14 ENCOUNTER — Other Ambulatory Visit: Payer: Self-pay

## 2024-03-14 ENCOUNTER — Emergency Department (HOSPITAL_BASED_OUTPATIENT_CLINIC_OR_DEPARTMENT_OTHER)
Admission: EM | Admit: 2024-03-14 | Discharge: 2024-03-14 | Disposition: A | Discharge status: Home / Self Care | Attending: Emergency Medicine | Admitting: Emergency Medicine

## 2024-03-14 DIAGNOSIS — W25XXXA Contact with sharp glass, initial encounter: Secondary | ICD-10-CM | POA: Diagnosis not present

## 2024-03-14 DIAGNOSIS — S01312A Laceration without foreign body of left ear, initial encounter: Secondary | ICD-10-CM | POA: Insufficient documentation

## 2024-03-14 MED ORDER — LIDOCAINE HCL (PF) 1 % IJ SOLN
5.0000 mL | Freq: Once | INTRAMUSCULAR | Status: DC
  Filled 2024-03-14: qty 5

## 2024-03-14 MED ORDER — LIDOCAINE-EPINEPHRINE-TETRACAINE (LET) TOPICAL GEL
3.0000 mL | Freq: Once | TOPICAL | Status: AC
  Administered 2024-03-14: 3 mL via TOPICAL
  Filled 2024-03-14: qty 3

## 2024-03-14 NOTE — ED Triage Notes (Signed)
 Left ear laceration after hitting it on the side of night table.  Bleeding is controlled.

## 2024-03-14 NOTE — ED Provider Notes (Signed)
 [place] [facility]  Provider Note  CSN: [id number] Arrival date & time: 03/14/24 0454  History Chief Complaint  Patient presents with   Laceration    Louis Barnes is a 73 y.o. male reports he turned over in bed suddenly due to a leg cramp and hit his L ear on the nightstand where a piece of glass cut the pinna. No other injuries. TDAP UTD, not on blood thinner.    Home Medications Prior to Admission medications   Medication Sig Start Date End Date Taking? Authorizing Provider  amLODipine  (NORVASC ) 5 MG tablet Take 5 mg by mouth daily.    [provider]  amoxicillin -clavulanate (AUGMENTIN ) 875-125 MG tablet Take 1 tablet by mouth every 12 (twelve) hours. 12/28/23   Iola Lukes, FNP  atorvastatin (LIPITOR) 20 MG tablet Take 20 mg by mouth daily.    [provider]  gabapentin  (NEURONTIN ) 300 MG capsule Take 300 mg by mouth at bedtime. 08/28/23   [provider]  hydrochlorothiazide  (HYDRODIURIL ) 12.5 MG tablet Take 12.5 mg by mouth every morning. 09/22/23   [provider]  irbesartan  (AVAPRO ) 300 MG tablet Take 300 mg by mouth daily.    [provider]  meloxicam (MOBIC) 7.5 MG tablet SMARTSIG:1 Tablet(s) By Mouth Every 12 Hours PRN    [provider]  methocarbamol  (ROBAXIN ) 500 MG tablet Take 1 tablet (500 mg total) by mouth every 6 (six) hours as needed for muscle spasms. 11/30/23   Meyran, Suzen Lacks, NP  methylPREDNISolone  (MEDROL  DOSEPAK) 4 MG TBPK tablet Take as directed 12/28/23   Iola Lukes, FNP  omeprazole (PRILOSEC) 20 MG capsule Take 20 mg by mouth 2 (two) times daily.    [provider]  oxyCODONE -acetaminophen  (PERCOCET) 5-325 MG tablet Take 1 tablet by mouth every 4 (four) hours as needed for severe pain (pain score 7-10). 11/30/23 11/29/24  Meyran, Suzen Lacks, NP  tadalafil (CIALIS) 20 MG tablet Take 20 mg by mouth daily as needed for  erectile dysfunction (every three).    [provider]  zolpidem  (AMBIEN  CR) 12.5 MG CR tablet Take 12.5 mg by mouth at bedtime as needed.  12/21/11   [provider]     Allergies    Patient has no known allergies.   Review of Systems   Review of Systems Please see HPI for pertinent positives and negatives  Physical Exam BP 132/79 (BP Location: Right Arm)   Pulse 73   Temp 99.4 F (37.4 C) (Oral)   Resp 17   SpO2 98%   Physical Exam Vitals and nursing note reviewed.  HENT:     Head: Normocephalic.     Ears:     Comments: 3cm curvilinear flap laceration to the superior L pinna    Nose: Nose normal.  Eyes:     Extraocular Movements: Extraocular movements intact.  Pulmonary:     Effort: Pulmonary effort is normal.  Musculoskeletal:        General: Normal range of motion.     Cervical back: Neck supple.  Skin:    Findings: No rash (on exposed skin).  Neurological:     Mental Status: He is alert and oriented to person, place, and time.  Psychiatric:        Mood and Affect: Mood normal.     ED Results / Procedures / Treatments   EKG None  Procedures .Laceration Repair  Date/Time: 03/14/2024 5:55 AM  Performed by: Roselyn Carlin NOVAK, MD  Authorized by: Roselyn Carlin NOVAK, MD   Consent:    Consent obtained:  Verbal   Consent given by:  Patient Anesthesia:    Anesthesia method:  Topical application   Topical anesthetic:  LET Laceration details:    Location:  Ear   Ear location:  L ear   Length (cm):  3 Pre-procedure details:    Preparation:  Patient was prepped and draped in usual sterile fashion Treatment:    Area cleansed with:  Saline   Irrigation solution:  Sterile saline   Irrigation method:  Syringe Skin repair:    Repair method:  Sutures   Suture size:  5-0   Suture material:  Nylon   Suture technique:  Simple interrupted   Number of sutures:  5 Approximation:    Approximation:  Close Repair type:    Repair type:   Simple Post-procedure details:    Dressing:  Non-adherent dressing   Procedure completion:  Tolerated well, no immediate complications   Medications Ordered in the ED Medications  lidocaine -EPINEPHrine -tetracaine (LET) topical gel (3 mLs Topical Given 03/14/24 0509)  lidocaine  (PF) (XYLOCAINE ) 1 % injection 5 mL (5 mLs Infiltration Given 03/14/24 0509)    Initial Impression and Plan  Patient here with L ear laceration, repaired as above. Wound care instructions given, suture removal in 7-10 days.   ED Course       MDM Rules/Calculators/A&P Medical Decision Making Problems Addressed: Laceration of left ear, initial encounter: acute illness or injury  Risk Prescription drug management.     Final Clinical Impression(s) / ED Diagnoses Final diagnoses:  Laceration of left ear, initial encounter    Rx / DC Orders ED Discharge Orders     None        Roselyn Carlin NOVAK, MD 03/14/24 [phone]

## 2024-03-25 ENCOUNTER — Encounter: Payer: Self-pay | Admitting: Cardiology

## 2024-03-25 ENCOUNTER — Ambulatory Visit: Attending: Cardiology | Admitting: Cardiology

## 2024-03-25 VITALS — BP 130/78 | HR 54 | Ht 69.0 in | Wt 203.0 lb

## 2024-03-25 DIAGNOSIS — E785 Hyperlipidemia, unspecified: Secondary | ICD-10-CM | POA: Diagnosis present

## 2024-03-25 DIAGNOSIS — I2510 Atherosclerotic heart disease of native coronary artery without angina pectoris: Secondary | ICD-10-CM | POA: Insufficient documentation

## 2024-03-25 DIAGNOSIS — G4733 Obstructive sleep apnea (adult) (pediatric): Secondary | ICD-10-CM | POA: Diagnosis present

## 2024-03-25 DIAGNOSIS — I10 Essential (primary) hypertension: Secondary | ICD-10-CM | POA: Diagnosis present

## 2024-03-25 DIAGNOSIS — N528 Other male erectile dysfunction: Secondary | ICD-10-CM | POA: Diagnosis present

## 2024-03-25 NOTE — Patient Instructions (Signed)
 Medication Instructions:   No changes  *If you need a refill on your cardiac medications before your next appointment, please call your pharmacy*   Lab Work: Not needed    Testing/Procedures: Your provider would like for you to have a Calcium Score CT. This test is painless. This is a non-contrast CT of the heat to look for calcified lesions in the coronary arteries. The cost of this is test cost $99 out of pocket and is not submitted to your insurance. The test can be performed at our [facility] Location in [place].  You can get the test scheduled in our office or you can call [phone].  Then press option 4, Then press option 2 Finally press option 2 and get your test scheduled.     Follow-Up: At [facility], you and your health needs are our priority.  As part of our continuing mission to provide you with exceptional heart care, we have created designated Provider Care Teams.  These Care Teams include your primary Cardiologist (physician) and Advanced Practice Providers (APPs -  Physician Assistants and Nurse Practitioners) who all work together to provide you with the care you need, when you need it.     Your next appointment:   4 month(s)  The format for your next appointment:   In Person  Provider:   Alm Clay, MD

## 2024-03-25 NOTE — Progress Notes (Unsigned)
 Cardiology Office Note:  .   Date:  03/27/2024  ID:  Carlin Merilee Sous, DOB 09/10/1950, MRN [id number] PCP: Charlott Dorn LABOR, MD  [facility] Providers Cardiologist:  Alm Clay, MD     No chief complaint on file.   Patient Profile: .     Anacleto Batterman is a relatively healthy 73 y.o. male with a PMH notable for HTN, HLD, CKD 2-3, OSA who presents here for evaluation and management of hypertension and Cardiovascular Risk Stratification with Coronary Calcification noted on Chest CT.  At the request of Charlott Dorn LABOR, MD.     Subjective  Discussed the use of AI scribe software for clinical note transcription with the patient, who gave verbal consent to proceed.  History of Present Illness Delma Villalva is a 73 year old male with hypertension who presents with elevated resting blood pressure.  He has been experiencing elevated resting blood pressure with recent readings of 171/87 and 168/87 while sitting. He monitors his blood pressure daily, noting it is often in the 130s and 140s, but occasionally higher. His current medications for blood pressure management include amlodipine  5 mg daily, HCTZ 12.5 mg daily, and Avapro  300 mg.  He has a significant family history of heart disease, with his father and uncles having had heart disease. Two uncles experienced myocardial infarctions, and his father had multiple cardiac failures before passing away from alcoholism.  He underwent back surgery, which has impacted his physical activity. Prior to surgery, he was very active, engaging in activities such as pickleball, rowing, and weightlifting. Post-surgery, he was inactive for three months but has started to become more active in the past month, although not yet at his previous level. He experiences cramps at night, which improve upon walking.  No chest tightness, pressure, or pain during regular activities, but he notes mild chest pain in a  specific area when using the area, not specifically when lifting weights. No orthopnea or paroxysmal nocturnal dyspnea. He has sleep apnea and coughs frequently due to GERD. No palpitations or irregular heartbeats.  His current medications include atorvastatin 20 mg for cholesterol, Cialis 20 mg as needed, and Ambien  12.5 mg for sleep. He no longer takes Neurontin , Percocet, Robaxin , Mobic, or Augmentin .     Objective   Medications Listed: Amlodipine  5 daily, Avapro  300 milligram daily, HCTZ 12.5 mg daily.  Atorvastatin 20 mg daily; omeprazole 40 mg twice daily; as needed Cialis 20 mg; as needed Ambien  12.5 mg nightly  Persistent asthma S/p lumbar fusion CKD 3.   Hypertension GERD OSA => in need of new CPAP equipment  Studies Reviewed: SABRA   EKG Interpretation Date/Time:  Monday March 25 2024 08:22:48 EST Ventricular Rate:  54 PR Interval:  160 QRS Duration:  80 QT Interval:  432 QTC Calculation: 409 R Axis:   13  Text Interpretation: Sinus bradycardia with sinus arrhythmia When compared with ECG of 27-Nov-2023 10:00, No significant change was found Confirmed by Clay Alm ([id number]) on 03/25/2024 8:31:42 AM    Results Labs Cholesterol (07/22/2023): 146 HDL (07/22/2023): 47 LDL (95/87/7974): 83 Triglycerides (07/22/2023): 82 Creatinine (11/2023): 1.16 Hemoglobin (11/2023): 14.0 Potassium (11/2023): 4.0  Radiology Chest CT (08/2023): Moderate coronary artery calcification  Diagnostic EKG: Within normal limits Lower extremity venous Dopplers on 03/11/2024: No evidence of DVT  Risk Assessment/Calculations:            Physical Exam:   VS:  BP 130/78   Pulse (!) 54   Ht 5' 9 (1.753  m)   Wt 203 lb (92.1 kg)   SpO2 94%   BMI 29.98 kg/m    Wt Readings from Last 3 Encounters:  03/25/24 203 lb (92.1 kg)  11/29/23 194 lb (88 kg)  11/27/23 199 lb (90.3 kg)     GEN: Well nourished, well groomed; in no acute distress; borderline obese but otherwise healthy  appearing.  NECK: No JVD; No carotid bruits CARDIAC: Normal S1, S2; RRR, no murmurs, rubs, gallops RESPIRATORY:  Clear to auscultation without rales, wheezing or rhonchi ; nonlabored, good air movement. ABDOMEN: Soft, non-tender, non-distended EXTREMITIES:  No edema; No deformity      ASSESSMENT AND PLAN: .    Problem List Items Addressed This Visit       Cardiology Problems   Coronary artery calcification seen on CAT scan (Chronic)   Hyperlipidemia LDL goal <100   Mild family history of CAD in an otherwise healthy elderly gentleman with hypertension, hyperlipidemia will order coronary calcium score for risk stratification  Cholesterol well-controlled with atorvastatin. Total cholesterol 146, HDL 47, LDL 83, triglycerides 82. - Continue atorvastatin 20 mg daily. - Check Coronary Calcium Score to set goal.: Adjust LDL targets based on coronary calcium score results.        Primary hypertension - Primary (Chronic)   Essential hypertension Blood pressure generally 130s-140s, occasional 171/87. On amlodipine , HCTZ, Avapro . No exertional symptoms. - Continue current antihypertensive regimen. - Consider increasing amlodipine  or HCTZ if blood pressure remains elevated.      Relevant Orders   EKG 12-Lead (Completed)   CT CARDIAC SCORING (SELF PAY ONLY) (Completed)     Other   Erectile dysfunction   Uses PRN Cialis.      Obstructive sleep apnea   Continue using CPAP.          Follow-Up: Return in about 4 months (around 07/24/2024) for 3-4 month follow-up, To discuss test results.  I spent 41 minutes in the care of Carlin Glatter Savoca today including reviewing labs (2 minutes), reviewing studies (CT chest-abdomen-pelvis) (2 minutes), face to face time discussing treatment options (18), reviewing records from PCP notes (2 minutes), 7 minutes dictating, and documenting in the encounter.      Signed, Alm MICAEL Clay, MD, MS Alm Clay, M.D., M.S. Interventional  Cardiologist  [facility] Pager # [phone]

## 2024-03-26 ENCOUNTER — Inpatient Hospital Stay (HOSPITAL_COMMUNITY)
Admission: RE | Admit: 2024-03-26 | Discharge: 2024-03-26 | Payer: Self-pay | Attending: Cardiology | Admitting: Cardiology

## 2024-03-26 DIAGNOSIS — I10 Essential (primary) hypertension: Secondary | ICD-10-CM

## 2024-03-27 ENCOUNTER — Ambulatory Visit: Payer: Self-pay | Admitting: Cardiology

## 2024-03-27 DIAGNOSIS — E785 Hyperlipidemia, unspecified: Secondary | ICD-10-CM | POA: Insufficient documentation

## 2024-03-27 DIAGNOSIS — I2510 Atherosclerotic heart disease of native coronary artery without angina pectoris: Secondary | ICD-10-CM | POA: Insufficient documentation

## 2024-03-27 NOTE — Assessment & Plan Note (Signed)
 Uses PRN Cialis.

## 2024-03-27 NOTE — Assessment & Plan Note (Signed)
 Essential hypertension Blood pressure generally 130s-140s, occasional 171/87. On amlodipine , HCTZ, Avapro . No exertional symptoms. - Continue current antihypertensive regimen. - Consider increasing amlodipine  or HCTZ if blood pressure remains elevated.

## 2024-03-27 NOTE — Assessment & Plan Note (Signed)
 Continue using CPAP.

## 2024-03-27 NOTE — Assessment & Plan Note (Signed)
 Mild family history of CAD in an otherwise healthy elderly gentleman with hypertension, hyperlipidemia will order coronary calcium score for risk stratification  Cholesterol well-controlled with atorvastatin. Total cholesterol 146, HDL 47, LDL 83, triglycerides 82. - Continue atorvastatin 20 mg daily. - Check Coronary Calcium Score to set goal.: Adjust LDL targets based on coronary calcium score results.

## 2024-04-22 ENCOUNTER — Telehealth: Payer: Self-pay | Admitting: Cardiology

## 2024-04-22 NOTE — Telephone Encounter (Signed)
 Patient is going to be out of town for the month of March and would to be seen sooner that than his March appt. I did not see anything in the month of February with Dr. Anner and patient did not want to see a APP. He states he received a message from Dr. Anner that he would like from him to start aggressive treatment. Please advise.

## 2024-04-24 NOTE — Telephone Encounter (Signed)
 Pt called in requesting sooner appt. Scheduled for 02/13 with Lum Louis, NP.

## 2024-04-24 NOTE — Telephone Encounter (Signed)
 Spoke with pt, states he is ok with seeing an APP but is very concerned with Dr Anner last message saying he needs aggressive treatment.   Coronary calcium score of 624. => This result suggests significant coronary plaque. => We need to do aggressive risk factor modification, and more detailed heart artery evaluation with Stress Test or Coronary CT Angiogram.   We can discuss in f/u appointment.   Alm Anner, MD  Reassured patient that Dr Anner stated in his note it was ok to discuss at follow up and did not seem urgent. Informed him as of now the appointment with Lum is the soonest, placed on wait list. Patient states if any sooner appt comes up, just change it. No need to call and ask. Will send along to Dr Anner and his nurse to advise on sooner appointments.

## 2024-04-25 NOTE — Telephone Encounter (Signed)
 Aggressive treatment means being more aggressive treating Risk FActors - close BP, blood sugar & lipid control to stabilize existing coronary artery plaque & preventing more plaque formation.   This means - lower target for Cholesterol levels (LDL < 70 or even < 50) for instance.  So it may need additional meds or higher doses of existing meds.  Alm Clay, MD

## 2024-04-25 NOTE — Telephone Encounter (Signed)
 Message sent to patient w/clarification from MD

## 2024-04-26 NOTE — Telephone Encounter (Signed)
 That would be one option depending on how you tolerate the increased dose. If you would like to try increasing to 40 mg for now, that would help for your up-coming visit - if doing fine = we can convert the Rx to 40 mg tabs.  DH

## 2024-04-30 ENCOUNTER — Other Ambulatory Visit: Payer: Self-pay | Admitting: Neurological Surgery

## 2024-04-30 DIAGNOSIS — M5416 Radiculopathy, lumbar region: Secondary | ICD-10-CM

## 2024-05-03 ENCOUNTER — Ambulatory Visit
Admission: RE | Admit: 2024-05-03 | Discharge: 2024-05-03 | Disposition: A | Source: Ambulatory Visit | Attending: Neurological Surgery | Admitting: Neurological Surgery

## 2024-05-03 DIAGNOSIS — M5416 Radiculopathy, lumbar region: Secondary | ICD-10-CM

## 2024-05-15 ENCOUNTER — Encounter: Payer: Self-pay | Admitting: Physician Assistant

## 2024-05-15 ENCOUNTER — Ambulatory Visit: Admitting: Physician Assistant

## 2024-05-15 VITALS — BP 134/70 | HR 77 | Ht 69.5 in | Wt 202.0 lb

## 2024-05-15 DIAGNOSIS — R0609 Other forms of dyspnea: Secondary | ICD-10-CM

## 2024-05-15 DIAGNOSIS — E785 Hyperlipidemia, unspecified: Secondary | ICD-10-CM | POA: Diagnosis not present

## 2024-05-15 DIAGNOSIS — I10 Essential (primary) hypertension: Secondary | ICD-10-CM | POA: Diagnosis not present

## 2024-05-15 DIAGNOSIS — I2510 Atherosclerotic heart disease of native coronary artery without angina pectoris: Secondary | ICD-10-CM | POA: Diagnosis not present

## 2024-05-15 MED ORDER — METOPROLOL TARTRATE 50 MG PO TABS: ORAL_TABLET | ORAL | 0 refills | Status: AC

## 2024-05-15 MED ORDER — ATORVASTATIN CALCIUM 40 MG PO TABS: 40.0000 mg | ORAL_TABLET | Freq: Every day | ORAL | 1 refills | Status: AC

## 2024-05-15 NOTE — Patient Instructions (Addendum)
 Medication Instructions:  START Aspirin 81mg  Take 1 tablet once a day  INCREASE Lipitor (Atorvastatin ) 40mg  Take 1 tablet once day  Take 1 time dose of Metoprolol  50mg  2 hours prior to test *If you need a refill on your cardiac medications before your next appointment, please call your pharmacy*  Lab Work: 6 WEEKS FASTING-LIPIDS & LFTS (06/26/24) If you have labs (blood work) drawn today and your tests are completely normal, you will receive your results only by: MyChart Message (if you have MyChart) OR A paper copy in the mail If you have any lab test that is abnormal or we need to change your treatment, we will call you to review the results.  Testing/Procedures: CORONARY CTA INSTRUCTIONS BELOW  Follow-Up: At [facility], you and your health needs are our priority.  As part of our continuing mission to provide you with exceptional heart care, our providers are all part of one team.  This team includes your primary Cardiologist (physician) and Advanced Practice Providers or APPs (Physician Assistants and Nurse Practitioners) who all work together to provide you with the care you need, when you need it.  Your next appointment:   6 month(s)  Provider:   Alm Clay, MD    We recommend signing up for the patient portal called MyChart.  Sign up information is provided on this After Visit Summary.  MyChart is used to connect with patients for Virtual Visits (Telemedicine).  Patients are able to view lab/test results, encounter notes, upcoming appointments, etc.  Non-urgent messages can be sent to your provider as well.   To learn more about what you can do with MyChart, go to [email or web address].   Other Instructions     Your cardiac CT will be scheduled at one of the below locations:   [facility] [address]  [place], [place] [address]  If scheduled at the Heart and [facility] at [address], please enter the parking lot  using the [address] entrance and use the FREE valet service at the patient drop-off area. Enter the building and check-in with registration on the main floor.  Please follow these instructions carefully (unless otherwise directed):  An IV will be required for this test and Nitroglycerin  will be given.  Hold all erectile dysfunction medications at least 3 days (72 hrs) prior to test. (Ie viagra, cialis, sildenafil, tadalafil, etc)   On the Night Before the Test: Be sure to Drink plenty of water. Do not consume any caffeinated/decaffeinated beverages or chocolate 12 hours prior to your test. Do not take any antihistamines 12 hours prior to your test.  On the Day of the Test: Drink plenty of water until 1 hour prior to the test. Do not eat any food 1 hour prior to test. You may take your regular medications prior to the test.  Take metoprolol  (Lopressor ) two hours prior to test. On the morning of your test HOLD Irbesartan . If you take Furosemide/Hydrochlorothiazide /Spironolactone/Chlorthalidone, please HOLD on the morning of the test. Patients who wear a continuous glucose monitor MUST remove the device prior to scanning.      After the Test: Drink plenty of water. After receiving IV contrast, you may experience a mild flushed feeling. This is normal. On occasion, you may experience a mild rash up to 24 hours after the test. This is not dangerous. If this occurs, you can take Benadryl  25 mg, Zyrtec , Claritin, or Allegra and increase your fluid intake. (Patients taking Tikosyn should avoid  Benadryl , and may take Zyrtec , Claritin, or Allegra) If you experience trouble breathing, this can be serious. If it is severe call 911 IMMEDIATELY. If it is mild, please call our office.  We will call to schedule your test 2-4 weeks out understanding that some insurance companies will need an authorization prior to the service being performed.   For more information and frequently asked questions,  please visit our website : [email or web address]  For non-scheduling related questions, please contact the cardiac imaging nurse navigator should you have any questions/concerns: Cardiac Imaging Nurse Navigators Direct Office Dial: [phone]   For scheduling needs, including cancellations and rescheduling, please call Brittany, [phone].  For billing questions, please call [phone].

## 2024-05-15 NOTE — Progress Notes (Unsigned)
 " Cardiology Office Note   Date:  05/15/2024  ID:  Yannis, Broce 1951/02/07, MRN [id number] PCP: Charlott Dorn LABOR, MD  [facility] Providers Cardiologist:  Alm Clay, MD { Click to update primary MD,subspecialty MD or APP then REFRESH:1}    History of Present Illness MILFRED KRAMMES is a 74 y.o. male with PMH of HTN, HLD, CKD stage 2-3, coronary artery calcification noted on previous chest CT and OSA on CPAP.  Patient was initially referred to cardiology service for management of high blood pressure and coronary calcification.  He has significant family history of heart disease was both father and uncles having heart issue.  2 uncles experienced a myocardial infarction.  Father had multiple heart failure before passing away from alcoholism.  He previously underwent back surgery which impacted his physical ability.  Prior to his back surgery, he used to be very active, however postsurgery he has not been very active.  Patient was previously seen by Dr. Clay on 03/25/2024, coronary calcium  scoring test was ordered which was completed on 03/26/2024.  This showed 3 mm right lower lobe pulmonary nodule, no routine follow-up was recommended.  Coronary calcium  score of 624 which placed the patient in the 69th percentile for age and sex matched control.  Of the rest of risk factor modification was recommended with condition to also consider either a stress test or coronary CTA.  He was started on 81 mg aspirin.  LDL goal is less than 70.  Atorvastatin  was increased to 40 mg daily.  Patient presents today for cardiac evaluation.  We reviewed the recent coronary calcium  score and Dr. Genice recommendation.  He has already started on the higher dose of atorvastatin  for the past week.  We will obtain a fasting lipid panel and fatigue in 6 to 8 weeks.  He is on 81 mg daily aspirin at night.  Most recent blood work obtained at PCPs office on 04/17/2024 showed creatinine 1.19, BUN 20,  eGFR 64, sodium 138, potassium 4.3.  Patient does complain of dyspnea on exertion but no chest pain.  We will proceed with coronary CTA.  Patient will need single dose of 50 mg metoprolol  in the morning with a coronary CTA, he will hold irbesartan  the morning of the study to avoid hypotension.  If coronary CTA is reassuring, he can follow-up with Dr. Clay in 6 months.  According to the patient, his systolic blood pressure has been in the 110s to 120s at home.  His blood pressure is well-controlled.  He never smoked in the past.  Main cardiac risk factors hypertension and hyperlipidemia.  ROS: ***  Studies Reviewed      *** Risk Assessment/Calculations {Does this patient have ATRIAL FIBRILLATION?:[phone]}         Physical Exam VS:  BP 134/70   Pulse 77   Ht 5' 9.5 (1.765 m)   Wt 202 lb (91.6 kg)   SpO2 97%   BMI 29.40 kg/m        Wt Readings from Last 3 Encounters:  05/15/24 202 lb (91.6 kg)  03/25/24 203 lb (92.1 kg)  11/29/23 194 lb (88 kg)    GEN: Well nourished, well developed in no acute distress NECK: No JVD; No carotid bruits CARDIAC: ***RRR, no murmurs, rubs, gallops RESPIRATORY:  Clear to auscultation without rales, wheezing or rhonchi  ABDOMEN: Soft, non-tender, non-distended EXTREMITIES:  No edema; No deformity   ASSESSMENT AND PLAN ***    {Are you ordering a CV Procedure (  e.g. stress test, cath, DCCV, TEE, etc)?   Press F2        :[id number]}  Dispo: ***  Signed, Scot Ford, PA  "

## 2024-05-16 ENCOUNTER — Encounter (HOSPITAL_COMMUNITY): Payer: Self-pay

## 2024-05-17 ENCOUNTER — Ambulatory Visit (HOSPITAL_COMMUNITY): Admission: RE | Admit: 2024-05-17

## 2024-05-17 ENCOUNTER — Ambulatory Visit: Payer: Self-pay | Admitting: Physician Assistant

## 2024-05-17 ENCOUNTER — Ambulatory Visit (HOSPITAL_COMMUNITY): Admission: RE | Admit: 2024-05-17 | Source: Ambulatory Visit

## 2024-05-17 ENCOUNTER — Other Ambulatory Visit: Payer: Self-pay | Admitting: Cardiology

## 2024-05-17 VITALS — BP 118/56 | HR 67

## 2024-05-17 DIAGNOSIS — R931 Abnormal findings on diagnostic imaging of heart and coronary circulation: Secondary | ICD-10-CM

## 2024-05-17 DIAGNOSIS — Z01812 Encounter for preprocedural laboratory examination: Secondary | ICD-10-CM

## 2024-05-17 DIAGNOSIS — E785 Hyperlipidemia, unspecified: Secondary | ICD-10-CM

## 2024-05-17 DIAGNOSIS — I2510 Atherosclerotic heart disease of native coronary artery without angina pectoris: Secondary | ICD-10-CM

## 2024-05-17 DIAGNOSIS — R0609 Other forms of dyspnea: Secondary | ICD-10-CM

## 2024-05-17 LAB — POCT I-STAT CREATININE: Creatinine, Ser: 1.2 mg/dL (ref 0.61–1.24)

## 2024-05-17 MED ORDER — NITROGLYCERIN 0.4 MG SL SUBL
0.8000 mg | SUBLINGUAL_TABLET | Freq: Once | SUBLINGUAL | Status: AC
  Administered 2024-05-17: 0.8 mg via SUBLINGUAL

## 2024-05-17 MED ORDER — IOHEXOL 350 MG/ML SOLN
100.0000 mL | Freq: Once | INTRAVENOUS | Status: AC | PRN
  Administered 2024-05-17: 100 mL via INTRAVENOUS

## 2024-05-17 MED ORDER — REGADENOSON 0.4 MG/5ML IV SOLN
INTRAVENOUS | Status: AC
  Filled 2024-05-17: qty 5

## 2024-05-17 NOTE — Progress Notes (Signed)
 Stable kidney

## 2024-05-17 NOTE — Progress Notes (Signed)
 I have called and spoken with the Louis Barnes, overall mild to moderate coronary artery disease, nothing severe.  FFR also shows no flow-limiting disease either.  Recently, his atorvastatin  was increased with LDL goal of less than 70.  Will continue aim at risk factor control by lowering the cholesterol.  Awaiting extracardiac portion to be read by radiologist.

## 2024-05-24 ENCOUNTER — Ambulatory Visit: Admitting: Emergency Medicine

## 2024-06-11 ENCOUNTER — Ambulatory Visit: Admitting: Cardiology
